# Patient Record
Sex: Female | Born: 1960 | Race: White | Hispanic: No | Marital: Single | Smoking: Never smoker
Health system: Southern US, Community
[De-identification: ages and names within clinical notes are randomized; demographics above are authoritative.]

## PROBLEM LIST (undated history)

## (undated) DIAGNOSIS — R87610 Atypical squamous cells of undetermined significance on cytologic smear of cervix (ASC-US): Secondary | ICD-10-CM

## (undated) DIAGNOSIS — R87810 Cervical high risk human papillomavirus (HPV) DNA test positive: Secondary | ICD-10-CM

## (undated) HISTORY — PX: KNEE SURGERY: SHX244

## (undated) HISTORY — DX: Atypical squamous cells of undetermined significance on cytologic smear of cervix (ASC-US): R87.610

## (undated) HISTORY — DX: Cervical high risk human papillomavirus (HPV) DNA test positive: R87.810

## (undated) HISTORY — PX: OOPHORECTOMY: SHX86

## (undated) HISTORY — PX: CARPAL TUNNEL RELEASE: SHX101

---

## 1998-04-01 ENCOUNTER — Other Ambulatory Visit: Admission: RE | Admit: 1998-04-01 | Discharge: 1998-04-01 | Payer: Self-pay | Admitting: Gynecology

## 1998-10-06 ENCOUNTER — Encounter: Payer: Self-pay | Admitting: Emergency Medicine

## 1998-10-06 ENCOUNTER — Emergency Department (HOSPITAL_COMMUNITY): Admission: EM | Admit: 1998-10-06 | Discharge: 1998-10-06 | Payer: Self-pay | Admitting: Emergency Medicine

## 1999-04-25 ENCOUNTER — Encounter: Admission: RE | Admit: 1999-04-25 | Discharge: 1999-04-25 | Payer: Self-pay | Admitting: Gynecology

## 1999-04-25 ENCOUNTER — Encounter: Payer: Self-pay | Admitting: Gynecology

## 1999-07-10 ENCOUNTER — Other Ambulatory Visit: Admission: RE | Admit: 1999-07-10 | Discharge: 1999-07-10 | Payer: Self-pay | Admitting: Gynecology

## 2000-06-15 ENCOUNTER — Encounter: Payer: Self-pay | Admitting: Gynecology

## 2000-06-15 ENCOUNTER — Encounter: Admission: RE | Admit: 2000-06-15 | Discharge: 2000-06-15 | Payer: Self-pay | Admitting: Gynecology

## 2001-07-07 ENCOUNTER — Encounter: Payer: Self-pay | Admitting: Obstetrics and Gynecology

## 2001-07-07 ENCOUNTER — Encounter: Admission: RE | Admit: 2001-07-07 | Discharge: 2001-07-07 | Payer: Self-pay | Admitting: Obstetrics and Gynecology

## 2002-07-11 ENCOUNTER — Encounter: Admission: RE | Admit: 2002-07-11 | Discharge: 2002-07-11 | Payer: Self-pay

## 2003-07-23 ENCOUNTER — Encounter: Admission: RE | Admit: 2003-07-23 | Discharge: 2003-07-23 | Payer: Self-pay | Admitting: Obstetrics and Gynecology

## 2004-01-11 ENCOUNTER — Other Ambulatory Visit: Admission: RE | Admit: 2004-01-11 | Discharge: 2004-01-11 | Payer: Self-pay | Admitting: Obstetrics and Gynecology

## 2004-04-20 HISTORY — PX: CERVICAL CONE BIOPSY: SUR198

## 2004-08-05 ENCOUNTER — Encounter: Admission: RE | Admit: 2004-08-05 | Discharge: 2004-08-05 | Payer: Self-pay | Admitting: Obstetrics and Gynecology

## 2005-01-13 ENCOUNTER — Other Ambulatory Visit: Admission: RE | Admit: 2005-01-13 | Discharge: 2005-01-13 | Payer: Self-pay | Admitting: Obstetrics and Gynecology

## 2005-02-04 ENCOUNTER — Encounter (INDEPENDENT_AMBULATORY_CARE_PROVIDER_SITE_OTHER): Payer: Self-pay | Admitting: *Deleted

## 2005-02-04 ENCOUNTER — Ambulatory Visit (HOSPITAL_COMMUNITY): Admission: RE | Admit: 2005-02-04 | Discharge: 2005-02-04 | Payer: Self-pay | Admitting: Obstetrics and Gynecology

## 2005-08-10 ENCOUNTER — Encounter: Admission: RE | Admit: 2005-08-10 | Discharge: 2005-08-10 | Payer: Self-pay | Admitting: Family Medicine

## 2005-10-06 ENCOUNTER — Other Ambulatory Visit: Admission: RE | Admit: 2005-10-06 | Discharge: 2005-10-06 | Payer: Self-pay | Admitting: Obstetrics and Gynecology

## 2006-08-18 ENCOUNTER — Encounter: Admission: RE | Admit: 2006-08-18 | Discharge: 2006-08-18 | Payer: Self-pay | Admitting: Obstetrics and Gynecology

## 2006-10-20 ENCOUNTER — Encounter: Admission: RE | Admit: 2006-10-20 | Discharge: 2006-10-20 | Payer: Self-pay | Admitting: Obstetrics and Gynecology

## 2006-11-30 ENCOUNTER — Other Ambulatory Visit: Admission: RE | Admit: 2006-11-30 | Discharge: 2006-11-30 | Payer: Self-pay | Admitting: Obstetrics and Gynecology

## 2007-08-26 ENCOUNTER — Encounter: Admission: RE | Admit: 2007-08-26 | Discharge: 2007-08-26 | Payer: Self-pay | Admitting: Obstetrics and Gynecology

## 2007-12-16 ENCOUNTER — Other Ambulatory Visit: Admission: RE | Admit: 2007-12-16 | Discharge: 2007-12-16 | Payer: Self-pay | Admitting: Obstetrics and Gynecology

## 2008-10-12 ENCOUNTER — Encounter: Admission: RE | Admit: 2008-10-12 | Discharge: 2008-10-12 | Payer: Self-pay | Admitting: Obstetrics and Gynecology

## 2008-10-24 ENCOUNTER — Encounter: Admission: RE | Admit: 2008-10-24 | Discharge: 2008-10-24 | Payer: Self-pay | Admitting: Obstetrics and Gynecology

## 2008-12-17 ENCOUNTER — Other Ambulatory Visit: Admission: RE | Admit: 2008-12-17 | Discharge: 2008-12-17 | Payer: Self-pay | Admitting: Obstetrics and Gynecology

## 2009-10-31 ENCOUNTER — Encounter: Admission: RE | Admit: 2009-10-31 | Discharge: 2009-10-31 | Payer: Self-pay | Admitting: Obstetrics and Gynecology

## 2009-12-26 ENCOUNTER — Other Ambulatory Visit: Admission: RE | Admit: 2009-12-26 | Discharge: 2009-12-26 | Payer: Self-pay | Admitting: Obstetrics and Gynecology

## 2010-05-12 ENCOUNTER — Encounter: Payer: Self-pay | Admitting: Obstetrics and Gynecology

## 2010-06-04 ENCOUNTER — Ambulatory Visit (INDEPENDENT_AMBULATORY_CARE_PROVIDER_SITE_OTHER): Payer: PRIVATE HEALTH INSURANCE | Admitting: Gynecology

## 2010-06-04 DIAGNOSIS — Z30431 Encounter for routine checking of intrauterine contraceptive device: Secondary | ICD-10-CM

## 2010-06-04 DIAGNOSIS — N926 Irregular menstruation, unspecified: Secondary | ICD-10-CM

## 2010-06-05 ENCOUNTER — Other Ambulatory Visit: Payer: PRIVATE HEALTH INSURANCE

## 2010-06-05 ENCOUNTER — Ambulatory Visit (INDEPENDENT_AMBULATORY_CARE_PROVIDER_SITE_OTHER): Payer: PRIVATE HEALTH INSURANCE | Admitting: Gynecology

## 2010-06-05 DIAGNOSIS — N926 Irregular menstruation, unspecified: Secondary | ICD-10-CM

## 2010-06-05 DIAGNOSIS — N930 Postcoital and contact bleeding: Secondary | ICD-10-CM

## 2010-06-05 DIAGNOSIS — N949 Unspecified condition associated with female genital organs and menstrual cycle: Secondary | ICD-10-CM

## 2010-09-05 NOTE — H&P (Signed)
Melanie Valenzuela, Melanie Valenzuela            ACCOUNT NO.:  [id number]   MEDICAL RECORD NO.:  [id number]          PATIENT TYPE:  AMB   LOCATION:  SDC                           FACILITY:  [facility]   PHYSICIAN:  Gerald Leitz, MD          DATE OF BIRTH:  [birth year]   DATE OF ADMISSION:  02/04/2005  DATE OF DISCHARGE:                                HISTORY & PHYSICAL   CHIEF COMPLAINT:  Cervical dysplasia.   HISTORY OF PRESENT ILLNESS:  This is a 50 year old gravida 2, para 2, who  has cervical dysplasia.  She had a Pap smear performed on January 13, 2005, that showed atypical squamous cells, cannot rule out high grade  lesions.  She had a cervical polyp removed that showed a squamous mucosa  with high grade squamous intraepithelial lesions.  She underwent a  colposcopy and had biopsies performed as well as an endocervical curettage.  The endocervical curettage came back high grade squamous intraepithelial  lesions, CIN-2 or 3.  Cervical biopsies at the 7 and 3 o'clock positions  showed low grade lesions.  She is scheduled to undergo cold knife conization  for further evaluation and possible treatment of her cervical dysplasia.   PAST OB/GYN HISTORY:  History of abnormal Pap smear status post cryosurgery  over 20 years ago.  Denies any history of sexually transmitted disease.  Spontaneous vaginal delivery x2.   PAST MEDICAL HISTORY:  Migraine headaches.   PAST SURGICAL HISTORY:  Mole from the abdomen and umbilical area which were  benign.  Ovarian cyst with laparotomy and knee surgery.   MEDICATIONS:  Yasmin.   ALLERGIES:  No known drug allergies.   SOCIAL HISTORY:  She is currently divorced.  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  She denies tobacco, alcohol, or illicit drug use.   FAMILY HISTORY:  No immediate family history of breast, ovarian, or colon  cancer.   REVIEW OF SYSTEMS:  Negative except as stated in history of current illness.   PHYSICAL EXAMINATION:  LUNGS:  Clear to  auscultation bilaterally.  HEART:  Regular rate and rhythm.  ABDOMEN:  Soft, nontender, and nondistended.  No masses.  EXTREMITIES:  No edema.  PELVIC:  Normal external female genitalia, no vulvar or vaginal lesions.  Bimanual examination approximately 10-12 weeks size uterus.  No adnexal  masses or tenderness.   ASSESSMENT:  Cervical dysplasia.  She will undergo cold knife conization for  treatment and further evaluation.  Risks, benefits, and alternatives to  surgery were discussed with the patient including but not limited to  infection,  bleeding, damage to surrounding organs, need for further surgery.  Also  discussed with the patient risks of transfusion including HIV, hepatitis B  and C, and transfusion reactions.  The patient understands and wishes to  proceed with cold knife conization.      Gerald Leitz, MD  Electronically Signed     TC/MEDQ  D:  01/30/2005  T:  01/30/2005  Job:  [id number]   cc:   [facility]

## 2010-09-05 NOTE — Op Note (Signed)
Melanie Valenzuela, SCHEWE            ACCOUNT NO.:  [id number]   MEDICAL RECORD NO.:  [id number]          PATIENT TYPE:  AMB   LOCATION:  SDC                           [facility]ACILITY:  [facility]   PHYSICIAN:  Gerald Leitz, MD          DATE OF BIRTH:  [birth year]   DATE OF PROCEDURE:  02/04/2005  DATE OF DISCHARGE:                                 OPERATIVE REPORT   PREOPERATIVE DIAGNOSIS:  Cervical dysplasia.   POSTOPERATIVE DIAGNOSIS:  Cervical dysplasia.   OPERATION/PROCEDURE:  Cold knife conization.   SURGEON:  Gerald Leitz, M.D.   ASSISTANT:  None.   ANESTHESIA:  MAC with paracervical block.   COMPLICATIONS:  None.   ESTIMATED BLOOD LOSS:  Approximately 10 mL.   INDICATIONS:  This is a 50 year old who has a Pap smear that showed ASCUS.  Cannot rule out high grade.  She had biopsy of two cervical polyps that were  found to be high-grade and is undergoing a cold knife conization for further  evaluation and treatment.   DESCRIPTION OF PROCEDURE:  The patient was taken to the operating room where  she was placed under MAC anesthesia.  The vagina was prepped in the usual  sterile fashion.  The bladder was emptied with in-and-out catheter with  approximately 100 mL. The patient was then draped in the usual sterile  fashion.  The weighted speculum was placed in the vagina.  Deaver was used  for retraction.  The cervix was grasped anteriorly with a single-tooth  tenaculum and 5 mL of 1% lidocaine with epinephrine were injected at the 4  o'clock and 7 o'clock positions to produce a paracervical block.  0 Monocryl  was placed at the 3 o'clock position and the 9 o'clock position and used for  traction.  The single-tooth tenaculum was removed.  Cold knife cone  assessment was obtained using 11-blade scalpel.  The cervix was incised  circumferentially and the conization assessment was removed.  Hemostasis was  obtained with Monsel's solution.  Excellent hemostasis was noted.  Surgicel  was placed into  the incision site.  The 0 Vicryls were tied across the  cervix and cut.  Again excellent hemostasis was noted.  The Deaver and  weighted  speculum were removed from the patient's vagina.  He was taken to the  recovery room awake and in stable condition.  Again there were no  complications.  Specimens:  Cold knife cone of the cervix labeled at the 12  o'clock position with suture.      Gerald Leitz, MD  Electronically Signed     TC/MEDQ  D:  02/04/2005  T:  02/05/2005  Job:  [id number]

## 2010-09-29 ENCOUNTER — Other Ambulatory Visit: Payer: Self-pay | Admitting: Family Medicine

## 2010-09-29 DIAGNOSIS — Z1231 Encounter for screening mammogram for malignant neoplasm of breast: Secondary | ICD-10-CM

## 2010-10-14 ENCOUNTER — Other Ambulatory Visit: Payer: Self-pay | Admitting: Physician Assistant

## 2010-11-05 ENCOUNTER — Ambulatory Visit: Payer: PRIVATE HEALTH INSURANCE

## 2010-11-07 ENCOUNTER — Ambulatory Visit
Admission: RE | Admit: 2010-11-07 | Discharge: 2010-11-07 | Disposition: A | Discharge status: Home / Self Care | Payer: PRIVATE HEALTH INSURANCE | Source: Ambulatory Visit | Attending: Family Medicine | Admitting: Family Medicine

## 2010-11-07 DIAGNOSIS — Z1231 Encounter for screening mammogram for malignant neoplasm of breast: Secondary | ICD-10-CM

## 2011-02-05 ENCOUNTER — Encounter: Payer: Self-pay | Admitting: Gynecology

## 2011-02-05 DIAGNOSIS — IMO0002 Reserved for concepts with insufficient information to code with codable children: Secondary | ICD-10-CM | POA: Insufficient documentation

## 2011-02-09 ENCOUNTER — Encounter: Payer: PRIVATE HEALTH INSURANCE | Admitting: Gynecology

## 2011-03-04 ENCOUNTER — Other Ambulatory Visit (HOSPITAL_COMMUNITY)
Admission: RE | Admit: 2011-03-04 | Discharge: 2011-03-04 | Disposition: A | Discharge status: Home / Self Care | Payer: PRIVATE HEALTH INSURANCE | Source: Ambulatory Visit | Attending: Gynecology | Admitting: Gynecology

## 2011-03-04 ENCOUNTER — Encounter: Payer: Self-pay | Admitting: Gynecology

## 2011-03-04 ENCOUNTER — Ambulatory Visit (INDEPENDENT_AMBULATORY_CARE_PROVIDER_SITE_OTHER): Payer: PRIVATE HEALTH INSURANCE | Admitting: Gynecology

## 2011-03-04 VITALS — BP 110/60 | Ht 62.0 in | Wt 127.0 lb

## 2011-03-04 DIAGNOSIS — Z01419 Encounter for gynecological examination (general) (routine) without abnormal findings: Secondary | ICD-10-CM | POA: Insufficient documentation

## 2011-03-04 DIAGNOSIS — B977 Papillomavirus as the cause of diseases classified elsewhere: Secondary | ICD-10-CM | POA: Insufficient documentation

## 2011-03-04 DIAGNOSIS — G43909 Migraine, unspecified, not intractable, without status migrainosus: Secondary | ICD-10-CM | POA: Insufficient documentation

## 2011-03-04 DIAGNOSIS — Z1159 Encounter for screening for other viral diseases: Secondary | ICD-10-CM | POA: Insufficient documentation

## 2011-03-04 DIAGNOSIS — Z30431 Encounter for routine checking of intrauterine contraceptive device: Secondary | ICD-10-CM

## 2011-03-04 DIAGNOSIS — N643 Galactorrhea not associated with childbirth: Secondary | ICD-10-CM

## 2011-03-04 NOTE — Progress Notes (Signed)
Melanie Valenzuela [birth year] [id number]        50 y.o.  for annual exam.  Doing well. She has a Mirena IUD since September 2009 has sporadic occasional periods.  Past medical history,surgical history, medications, allergies, family history and social history were all reviewed and documented in the EPIC chart. ROS:  Was performed and pertinent positives and negatives are included in the history.  Exam: chaperone present Filed Vitals:   03/04/11 1531  BP: 110/60   General appearance  Normal Skin grossly normal Head/Neck normal with no cervical or supraclavicular adenopathy thyroid normal Lungs  clear Cardiac RR, without RMG Abdominal  soft, nontender, without masses, organomegaly or hernia Breasts  examined lying and sitting without masses, retractions, discharge or axillary adenopathy. Pelvic  Ext/BUS/vagina  normal   Cervix  normal  IUD string visualized, Pap done  Uterus  anteverted, normal size, shape and contour, midline and mobile nontender   Adnexa  Without masses or tenderness    Anus and perineum  normal   Rectovaginal  normal sphincter tone without palpated masses or tenderness.    Assessment/Plan:  50 y.o. female for annual exam.    1. IUD contraception. Patient is doing well with this with sporadic menses. Placed 12/2007. 2. 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 Follow up Pap smears have been normal. Pap was done today we'll continue annual cytology. 3. Galactorrhea.  Patient notes bilateral clear to milky galactorrhea primarily during intercourse. Never bloody, normal self breast exams, recent mammogram July 2012. We'll check baseline prolactin but assuming normal will continue expected management. If ever bloody she is to follow up ASAP. 4. Health maintenance. She sees Dr. Cam Hai routinely and does her blood work. The blood work was done today other than her prolactin level. Assuming she continues well from a gynecologic  standpoint and she'll see me in a year sooner as needed.    Dara Lords MD, 4:14 PM 03/04/2011

## 2011-03-11 ENCOUNTER — Telehealth: Payer: Self-pay | Admitting: *Deleted

## 2011-03-11 NOTE — Telephone Encounter (Signed)
SPOKE WITH PT THIS AM ABOUT RECENT RESULT ON PAP REGARDING MILD ATYPIA & HIGH RISK HPV SCREEN NEGATIVE,AND RECHECK IN 6 MONTHS. PT IS VERY CONCERNED BECAUSE HER PAP SMEARS CONTINUE TO COME BACK ABNORMAL SHE MADE AN OFFICE VISIT TO SPEAK WITH YOU ABOUT THIS ON 03/18/11. BUT THEN PT CALLS BACK STATING SHE VERY WORRIED ABOUT THIS PAP RESULT AND WOULD LIKE TO SPEAK WITH YOU VIA PHONE. PLEASE ADVISE

## 2011-03-11 NOTE — Telephone Encounter (Signed)
Pt spoke with TF.

## 2011-03-11 NOTE — Telephone Encounter (Signed)
Call patient with Pap smear results which showed ASCUS negative high risk HPV. In review of her chart she had negative annual Pap smears and 2011 through 2008. She does have a history of positive high-risk HPV in the past number of years ago and she is very concerned about this she is status post cold-like conization. I reviewed with her ASCUS negative high risk HPV that my recommendation would be to repeat the Pap smear in 6 months and we'll follow her expectantly. If you have persistent ASCUS high-risk HPV then we would proceed with colposcopy. She is comfortable with this knows the importance of follow up and will return in 6 months.

## 2011-03-11 NOTE — Telephone Encounter (Signed)
Get patient on the phone and I need the chart

## 2011-03-17 ENCOUNTER — Telehealth: Payer: Self-pay | Admitting: *Deleted

## 2011-03-17 NOTE — Telephone Encounter (Signed)
Pt called wanting to know if she could have her repeat pap in 3 months rather than 6 months? You had a detailed telephone conversation with pt about her pap results on 03/11/11. I explained to pt that insurance may not cover this pap if done in 3 months, pt says she doesn't care she is very concerned. Please advise if pap can be done in 3 months rather than 6 months.

## 2011-03-17 NOTE — Telephone Encounter (Signed)
okay

## 2011-03-17 NOTE — Telephone Encounter (Signed)
Lm on pt vm okay to have 3 month pap rather than 6 month pap follow up.

## 2011-03-18 ENCOUNTER — Ambulatory Visit: Payer: PRIVATE HEALTH INSURANCE | Admitting: Gynecology

## 2011-03-27 ENCOUNTER — Encounter: Payer: Self-pay | Admitting: Gynecology

## 2011-03-27 ENCOUNTER — Ambulatory Visit (INDEPENDENT_AMBULATORY_CARE_PROVIDER_SITE_OTHER): Payer: PRIVATE HEALTH INSURANCE | Admitting: Gynecology

## 2011-03-27 DIAGNOSIS — N9089 Other specified noninflammatory disorders of vulva and perineum: Secondary | ICD-10-CM

## 2011-03-27 NOTE — Progress Notes (Signed)
Patient presents having just felt a bump on her left labia minora that she wants me to look at. It does not hurt she never noticed this before.  Exam with chaperone present Pelvic external with small sebaceous-like cyst several millimeters across upper left labia minus consistent with a sebaceous cyst. Remainder of her external exam is normal. BUS vagina normal to inspection and palpation. Cervix normal IUD string visualized. Uterus normal size midline mobile nontender. Adnexa without masses or tenderness.  Assessment and plan: Small benign-appearing sebaceous cyst upper left labia minus. Patient will observe as long as it remains stable or smaller she'll follow up enlarges or starts to bother her she'll represent for evaluation. Patient is coming back in 3 months for repeat Pap smear due to ascus negative high risk HPV. I had recommended a one-year follow up but she felt more comfortable with a sooner repeat.

## 2011-03-27 NOTE — Patient Instructions (Signed)
Continue with self exam. As long as cyst remains stable or gets smaller then we'll watch. If increases in size or starts causing pain follow up for evaluation

## 2011-05-26 ENCOUNTER — Ambulatory Visit: Payer: PRIVATE HEALTH INSURANCE | Admitting: Gynecology

## 2011-05-27 ENCOUNTER — Ambulatory Visit: Payer: PRIVATE HEALTH INSURANCE | Admitting: Gynecology

## 2011-06-02 ENCOUNTER — Other Ambulatory Visit (HOSPITAL_COMMUNITY)
Admission: RE | Admit: 2011-06-02 | Discharge: 2011-06-02 | Disposition: A | Discharge status: Home / Self Care | Payer: PRIVATE HEALTH INSURANCE | Source: Ambulatory Visit | Attending: Gynecology | Admitting: Gynecology

## 2011-06-02 ENCOUNTER — Encounter: Payer: Self-pay | Admitting: Gynecology

## 2011-06-02 ENCOUNTER — Ambulatory Visit (INDEPENDENT_AMBULATORY_CARE_PROVIDER_SITE_OTHER): Payer: PRIVATE HEALTH INSURANCE | Admitting: Gynecology

## 2011-06-02 DIAGNOSIS — R87610 Atypical squamous cells of undetermined significance on cytologic smear of cervix (ASC-US): Secondary | ICD-10-CM

## 2011-06-02 DIAGNOSIS — Z01419 Encounter for gynecological examination (general) (routine) without abnormal findings: Secondary | ICD-10-CM | POA: Insufficient documentation

## 2011-06-02 NOTE — Progress Notes (Signed)
Patient presents for follow up Pap smear. She had an ASCUS negative high-risk HPV 3 months ago and was recommended to have a repeat in a year but she was very nervous on to have a 3 month follow up. She does have a history of high-grade dysplasia in the past. Also was recently seen with a small sebaceous type cyst in the upper left labia which she says has resolved her self-exam.  Exam with Selena Batten chaperone present Pelvic external BUS vagina normal no evidence of prior cyst. Cervix normal IUD string visualized Pap done.  Assessment and plan: History of ascus negative high-risk HPV for repeat Pap smear. Assuming negative we'll repeat a urine she's due for her annual. If persistently atypical then we'll proceed with colposcopy. Prior small sebaceous type cyst has resolved the patient will continue with self exams of any recurrences or other issue she will represent.

## 2011-06-02 NOTE — Patient Instructions (Signed)
Follow-up for Pap smear results. 

## 2011-06-30 ENCOUNTER — Other Ambulatory Visit: Payer: Self-pay | Admitting: Physician Assistant

## 2011-07-07 ENCOUNTER — Other Ambulatory Visit: Payer: Self-pay | Admitting: Family Medicine

## 2011-07-07 DIAGNOSIS — N63 Unspecified lump in unspecified breast: Secondary | ICD-10-CM

## 2011-07-07 DIAGNOSIS — N644 Mastodynia: Secondary | ICD-10-CM

## 2011-07-15 ENCOUNTER — Ambulatory Visit
Admission: RE | Admit: 2011-07-15 | Discharge: 2011-07-15 | Disposition: A | Discharge status: Home / Self Care | Payer: PRIVATE HEALTH INSURANCE | Source: Ambulatory Visit | Attending: Family Medicine | Admitting: Family Medicine

## 2011-07-15 DIAGNOSIS — N644 Mastodynia: Secondary | ICD-10-CM

## 2011-07-15 DIAGNOSIS — N63 Unspecified lump in unspecified breast: Secondary | ICD-10-CM

## 2011-08-13 ENCOUNTER — Other Ambulatory Visit: Payer: Self-pay | Admitting: Physician Assistant

## 2011-09-22 ENCOUNTER — Telehealth: Payer: Self-pay | Admitting: *Deleted

## 2011-09-22 NOTE — Telephone Encounter (Signed)
Left message on pt voicemail that no repeat pap needed per result note 06/02/11

## 2011-11-03 ENCOUNTER — Other Ambulatory Visit: Payer: Self-pay | Admitting: Family Medicine

## 2011-11-03 DIAGNOSIS — Z1231 Encounter for screening mammogram for malignant neoplasm of breast: Secondary | ICD-10-CM

## 2011-11-25 ENCOUNTER — Ambulatory Visit
Admission: RE | Admit: 2011-11-25 | Discharge: 2011-11-25 | Disposition: A | Discharge status: Home / Self Care | Payer: BC Managed Care – PPO | Source: Ambulatory Visit | Attending: Family Medicine | Admitting: Family Medicine

## 2011-11-25 DIAGNOSIS — Z1231 Encounter for screening mammogram for malignant neoplasm of breast: Secondary | ICD-10-CM

## 2012-02-05 ENCOUNTER — Other Ambulatory Visit: Payer: Self-pay | Admitting: Family Medicine

## 2012-02-05 DIAGNOSIS — M79629 Pain in unspecified upper arm: Secondary | ICD-10-CM

## 2012-02-26 ENCOUNTER — Ambulatory Visit
Admission: RE | Admit: 2012-02-26 | Discharge: 2012-02-26 | Disposition: A | Discharge status: Home / Self Care | Payer: BC Managed Care – PPO | Source: Ambulatory Visit | Attending: Family Medicine | Admitting: Family Medicine

## 2012-02-26 ENCOUNTER — Other Ambulatory Visit: Payer: Self-pay | Admitting: Family Medicine

## 2012-02-26 DIAGNOSIS — M79629 Pain in unspecified upper arm: Secondary | ICD-10-CM

## 2012-03-09 ENCOUNTER — Other Ambulatory Visit (HOSPITAL_COMMUNITY)
Admission: RE | Admit: 2012-03-09 | Discharge: 2012-03-09 | Disposition: A | Discharge status: Home / Self Care | Payer: BC Managed Care – PPO | Source: Ambulatory Visit | Attending: Gynecology | Admitting: Gynecology

## 2012-03-09 ENCOUNTER — Encounter: Payer: Self-pay | Admitting: Gynecology

## 2012-03-09 ENCOUNTER — Ambulatory Visit (INDEPENDENT_AMBULATORY_CARE_PROVIDER_SITE_OTHER): Payer: BC Managed Care – PPO | Admitting: Gynecology

## 2012-03-09 VITALS — BP 110/60 | Ht 63.0 in | Wt 129.0 lb

## 2012-03-09 DIAGNOSIS — Z30431 Encounter for routine checking of intrauterine contraceptive device: Secondary | ICD-10-CM

## 2012-03-09 DIAGNOSIS — Z1151 Encounter for screening for human papillomavirus (HPV): Secondary | ICD-10-CM | POA: Insufficient documentation

## 2012-03-09 DIAGNOSIS — Z01419 Encounter for gynecological examination (general) (routine) without abnormal findings: Secondary | ICD-10-CM

## 2012-03-09 NOTE — Patient Instructions (Signed)
Followup for Pap smear results. Followup in one year for annual exam. 

## 2012-03-09 NOTE — Addendum Note (Signed)
Addended by: Richardson Chiquito on: 03/09/2012 05:03 PM   Modules accepted: Orders

## 2012-03-09 NOTE — Progress Notes (Signed)
Kateland Leisinger Shrader [birth year] [id number]        51 y.o.  [id number] for annual exam.    Past medical history,surgical history, medications, allergies, family history and social history were all reviewed and documented in the EPIC chart. ROS:  Was performed and pertinent positives and negatives are included in the history.  Exam: Sherrilyn Rist assistant Filed Vitals:   03/09/12 1618  BP: 110/60  Height: 5\' 3"  (1.6 m)  Weight: 129 lb (58.514 kg)   General appearance  Normal Skin grossly normal Head/Neck normal with no cervical or supraclavicular adenopathy thyroid normal Lungs  clear Cardiac RR, without RMG Abdominal  soft, nontender, without masses, organomegaly or hernia Breasts  examined lying and sitting without masses, retractions, discharge or axillary adenopathy. Pelvic  Ext/BUS/vagina  normal   Cervix  normal Pap/HPV, IUD string visualized  Uterus  anteverted, normal size, shape and contour, midline and mobile nontender   Adnexa  Without masses or tenderness    Anus and perineum  normal   Rectovaginal  normal sphincter tone without palpated masses or tenderness.    Assessment/Plan:  51 y.o. [id number] female for annual exam.   1. Mirena IUD 12/2007. Patient doing well. Does still have menses but will skip a month every once in a while. 2 to be replaced next year I reminded her of this.  No hot flashes/night sweats to suggest menopausal changes. 2. Pap smear. Pap/HPV done today. History of cone biopsy for high-grade dysplasia 2006.  Ascus negative high-risk HPV 02/2011. Benign reactive changes 05/2011. Patient very anxious about the abnormality and is requesting more frequent Pap smear screening. We'll follow her for these results and as long as these are benign then we'll continue with annual cytology/HPV. 3. Mammography 02/2012. Had follow up views from August which were stable and recommended repeat August 2014. SBE monthly reviewed. 4. Colonoscopy 12/2011. Follow up if there recommended  interval.  5. Health maintenance. No blood work done today since all done through her primary physician's office. Follow up one year, sooner as needed.    Dara Lords MD, 4:44 PM 03/09/2012

## 2012-04-29 ENCOUNTER — Other Ambulatory Visit: Payer: Self-pay | Admitting: Family Medicine

## 2012-10-28 ENCOUNTER — Encounter: Payer: Self-pay | Admitting: Gynecology

## 2012-10-28 ENCOUNTER — Ambulatory Visit (INDEPENDENT_AMBULATORY_CARE_PROVIDER_SITE_OTHER): Payer: BC Managed Care – PPO | Admitting: Gynecology

## 2012-10-28 VITALS — BP 120/72

## 2012-10-28 DIAGNOSIS — N926 Irregular menstruation, unspecified: Secondary | ICD-10-CM

## 2012-10-28 MED ORDER — SUMATRIPTAN SUCCINATE 100 MG PO TABS: 100.0000 mg | ORAL_TABLET | ORAL | Status: DC | PRN

## 2012-10-28 NOTE — Patient Instructions (Signed)
Follow up for ultrasound as scheduled 

## 2012-10-28 NOTE — Progress Notes (Signed)
Patient notes resumption of regular monthly menses this year. Now over the last several weeks she has bled on and off continuously. She has a Mirena IUD inserted 12/2007. Not having hot flashes night sweats or other symptoms.  Exam with Blanca Asst. Abdomen soft nontender without masses guarding rebound organomegaly. Pelvic external BUS vagina grossly normal without active bleeding. Cervix normal IUD string visualized. Uterus anteverted normal size midline mobile nontender. Adnexa without masses or tenderness.  Assessment and plan: Irregular bleeding year 5 of IUD due to be replaced soon. Check baseline labs to include TSH FSH prolactin hCG. Start with ultrasound for pelvic assessment. Patient will followup with these results and we'll go from there. Issues about replacing her IUD now versus removing, using backup contraception for 6 months and seeing what we do from a menstrual standpoint discussed. We'll await her [facility] results and other lab work and then further discuss.

## 2012-11-04 ENCOUNTER — Encounter: Payer: Self-pay | Admitting: Gynecology

## 2012-11-04 ENCOUNTER — Ambulatory Visit (INDEPENDENT_AMBULATORY_CARE_PROVIDER_SITE_OTHER): Payer: BC Managed Care – PPO

## 2012-11-04 ENCOUNTER — Ambulatory Visit (INDEPENDENT_AMBULATORY_CARE_PROVIDER_SITE_OTHER): Payer: BC Managed Care – PPO | Admitting: Gynecology

## 2012-11-04 DIAGNOSIS — T8389XD Other specified complication of genitourinary prosthetic devices, implants and grafts, subsequent encounter: Secondary | ICD-10-CM

## 2012-11-04 DIAGNOSIS — N83209 Unspecified ovarian cyst, unspecified side: Secondary | ICD-10-CM

## 2012-11-04 DIAGNOSIS — N926 Irregular menstruation, unspecified: Secondary | ICD-10-CM

## 2012-11-04 NOTE — Patient Instructions (Signed)
Followup in 2 months for ultrasound as scheduled.

## 2012-11-04 NOTE — Progress Notes (Signed)
Patient follows up for ultrasound with history of irregular bleeding with Mirena IUD.  Ultrasound shows normal uterine size. Endometrial echo 3.5 mm. IUD in normal location. Right ovary normal. Left ovary with echo-free thin-walled avascular cyst 42 x 31 x 33 mm (35 mm mean). Smaller cyst adjacent. Larger cyst with groundglass appearance. Smaller cyst with more echogenic groundglass appearance. No free fluid.  Assessment and plan: Irregular bleeding 52 year old with IUD. Hormone studies to include hCG TSH FSH prolactin normal.  IUD was in normal location on ultrasound. Left cystic mass 35mm mean avascular questionable small endometrioma versus hemorrhagic cyst. Reviewed differential to include physiologic versus tumor. Options for management reviewed to include surgery now versus expectant management. Recommend patient plan expectant management for now with re\re ultrasound in 2 months. Will hold on CA 125 given small size and benign appearance as well as possibility for endometriosis causing a confusing value. Patient understands cannot guarantee not early malignancy but this point she is comfortable with observation with re\re ultrasound in 2 months. We'll continue to follow her bleeding continues and may consider removing the IUD and ultimate endometrial assessment if irregular bleeding continues despite that.

## 2012-11-08 ENCOUNTER — Other Ambulatory Visit: Payer: Self-pay | Admitting: Gynecology

## 2012-11-08 ENCOUNTER — Telehealth: Payer: Self-pay | Admitting: Gynecology

## 2012-11-08 DIAGNOSIS — Z3049 Encounter for surveillance of other contraceptives: Secondary | ICD-10-CM

## 2012-11-08 MED ORDER — LEVONORGESTREL 20 MCG/24HR IU IUD: INTRAUTERINE_SYSTEM | Freq: Once | INTRAUTERINE | Status: DC

## 2012-11-08 NOTE — Telephone Encounter (Signed)
11/08/12-I LM VM that pt [facility] insurance covers the MIrena & insertion at 100%, no copay or deductible. She will call to schedule appt with next cycle/WL

## 2012-11-09 ENCOUNTER — Other Ambulatory Visit: Payer: Self-pay

## 2012-11-09 DIAGNOSIS — Z1231 Encounter for screening mammogram for malignant neoplasm of breast: Secondary | ICD-10-CM

## 2012-11-18 ENCOUNTER — Other Ambulatory Visit: Payer: BC Managed Care – PPO

## 2012-11-18 ENCOUNTER — Ambulatory Visit: Payer: BC Managed Care – PPO | Admitting: Gynecology

## 2012-11-29 ENCOUNTER — Ambulatory Visit: Payer: BC Managed Care – PPO

## 2012-12-13 ENCOUNTER — Ambulatory Visit
Admission: RE | Admit: 2012-12-13 | Discharge: 2012-12-13 | Disposition: A | Discharge status: Home / Self Care | Payer: BC Managed Care – PPO | Source: Ambulatory Visit

## 2012-12-13 DIAGNOSIS — Z1231 Encounter for screening mammogram for malignant neoplasm of breast: Secondary | ICD-10-CM

## 2012-12-21 ENCOUNTER — Ambulatory Visit (INDEPENDENT_AMBULATORY_CARE_PROVIDER_SITE_OTHER): Payer: BC Managed Care – PPO

## 2012-12-21 ENCOUNTER — Encounter: Payer: Self-pay | Admitting: Gynecology

## 2012-12-21 ENCOUNTER — Ambulatory Visit (INDEPENDENT_AMBULATORY_CARE_PROVIDER_SITE_OTHER): Payer: BC Managed Care – PPO | Admitting: Gynecology

## 2012-12-21 DIAGNOSIS — N8300 Follicular cyst of ovary, unspecified side: Secondary | ICD-10-CM

## 2012-12-21 DIAGNOSIS — N921 Excessive and frequent menstruation with irregular cycle: Secondary | ICD-10-CM

## 2012-12-21 DIAGNOSIS — Z975 Presence of (intrauterine) contraceptive device: Secondary | ICD-10-CM

## 2012-12-21 DIAGNOSIS — N83209 Unspecified ovarian cyst, unspecified side: Secondary | ICD-10-CM

## 2012-12-21 NOTE — Progress Notes (Signed)
Patient presents for followup ultrasound. Was having irregular bleeding with Mirena IUD and had an ultrasound which showed a 35 mm mean cyst on the left ovary. She was very concerned about the possibility of ovarian tumors and requested rescanning in a short interval.  Ultrasound today shows uterus normal size. Endometrial echo 1.9 mm. IUD within the normal location. Right and left ovaries with physiologic changes of prior cyst has resolved. Cul-de-sac negative.  Assessment and plan: Resolution of the prior cyst. She was asymptomatic from this and the ultrasound originally was ordered because of her bleeding. She no longer is bleeding with her IUD. It is due to be removed in September and the question as to whether to replace it or remove it and monitor at age 52 was reviewed. She's not having menopausal symptoms and had a normal FSH in July 2014.  She has her annual exam scheduled in November and asked if she could wait until then to have it removed or replaced at her choice. I reviewed technically the contraceptive efficacy cannot be assured after 5 years even by months but that there were studies to imply the efficacy does go longer than 5 years. She has decided to keep it through November at her choice and then we'll decide whether to have it replaced or removed with alternative contraception and monitoring of her menses.

## 2012-12-21 NOTE — Patient Instructions (Signed)
Followup in November for your annual exam and IUD decision.

## 2013-01-04 ENCOUNTER — Other Ambulatory Visit: Payer: BC Managed Care – PPO

## 2013-01-04 ENCOUNTER — Ambulatory Visit: Payer: BC Managed Care – PPO | Admitting: Gynecology

## 2013-01-25 ENCOUNTER — Telehealth: Payer: Self-pay | Admitting: *Deleted

## 2013-01-25 NOTE — Telephone Encounter (Signed)
Pt would like PCP Dr.Kimblery Clelia Croft to have recent ultrasound report.  Most recent report faxed.

## 2013-02-27 ENCOUNTER — Ambulatory Visit
Admission: RE | Admit: 2013-02-27 | Discharge: 2013-02-27 | Disposition: A | Discharge status: Home / Self Care | Payer: BC Managed Care – PPO | Source: Ambulatory Visit | Attending: Family Medicine | Admitting: Family Medicine

## 2013-02-27 ENCOUNTER — Other Ambulatory Visit: Payer: Self-pay | Admitting: Family Medicine

## 2013-02-27 DIAGNOSIS — K219 Gastro-esophageal reflux disease without esophagitis: Secondary | ICD-10-CM

## 2013-02-27 DIAGNOSIS — M25511 Pain in right shoulder: Secondary | ICD-10-CM

## 2013-03-10 ENCOUNTER — Other Ambulatory Visit (HOSPITAL_COMMUNITY)
Admission: RE | Admit: 2013-03-10 | Discharge: 2013-03-10 | Disposition: A | Discharge status: Home / Self Care | Payer: BC Managed Care – PPO | Source: Ambulatory Visit | Attending: Gynecology | Admitting: Gynecology

## 2013-03-10 ENCOUNTER — Ambulatory Visit (INDEPENDENT_AMBULATORY_CARE_PROVIDER_SITE_OTHER): Payer: BC Managed Care – PPO | Admitting: Gynecology

## 2013-03-10 ENCOUNTER — Encounter: Payer: Self-pay | Admitting: Gynecology

## 2013-03-10 VITALS — BP 116/74 | Ht 63.0 in | Wt 127.0 lb

## 2013-03-10 DIAGNOSIS — Z01419 Encounter for gynecological examination (general) (routine) without abnormal findings: Secondary | ICD-10-CM | POA: Insufficient documentation

## 2013-03-10 DIAGNOSIS — N9089 Other specified noninflammatory disorders of vulva and perineum: Secondary | ICD-10-CM

## 2013-03-10 DIAGNOSIS — N83209 Unspecified ovarian cyst, unspecified side: Secondary | ICD-10-CM

## 2013-03-10 DIAGNOSIS — Z1151 Encounter for screening for human papillomavirus (HPV): Secondary | ICD-10-CM | POA: Insufficient documentation

## 2013-03-10 DIAGNOSIS — Z30431 Encounter for routine checking of intrauterine contraceptive device: Secondary | ICD-10-CM

## 2013-03-10 NOTE — Progress Notes (Signed)
This is well below one year if Melanie Valenzuela 1961-01-08 [id number]        52 y.o.  G2P2002 for annual exam.  Several issues noted below.  Past medical history,surgical history, problem list, medications, allergies, family history and social history were all reviewed and documented in the EPIC chart.  ROS:  Performed and pertinent positives and negatives are included in the history, assessment and plan .  Exam: Kim assistant Filed Vitals:   03/10/13 1558  BP: 116/74  Height: 5\' 3"  (1.6 m)  Weight: 127 lb (57.607 kg)   General appearance  Normal Skin grossly normal Head/Neck normal with no cervical or supraclavicular adenopathy thyroid normal Lungs  clear Cardiac RR, without RMG Abdominal  soft, nontender, without masses, organomegaly or hernia Breasts  examined lying and sitting without masses, retractions, discharge or axillary adenopathy. Pelvic  Ext/BUS/vagina  normal. Very small benign appearing mole right mid labia majora  Cervix  normal with IUD string visualized. Pap/HPV done  Uterus  anteverted, normal size, shape and contour, midline and mobile nontender   Adnexa  Without masses or tenderness    Anus and perineum  normal   Rectovaginal  normal sphincter tone without palpated masses or tenderness.    Assessment/Plan:  52 y.o. G34P2002 female for annual exam, regular menses, Mirena IUD.   1. Regular menses/Mirena IUD.  Continues to have monthly menses. At the 5 year mark with her Mirena IUD. Options to remove and replace versus removing monitor discussed. Had normal FSH in July. Patient has decided to have it removed, use backup contraception and monitor her cycles for now. Her IUD string was visualized, grasped with the [facility] forcep and the Mirena IUD was removed, shown to the patient and discarded. Patient will keep menstrual calendar and we'll see how she does. Currently not having any menopausal symptoms. Need to use backup contraception stressed. 2. Pap smear/HPV  negative 2013.  History of cone biopsy for HGSIL 2006. Pap smears have been normal afterwards until 2 years ago when she had ASCUS negative high risk HPV. She has had 2 subsequent negative Pap smears. She is very concerned and requests Pap/HPV this year. Pap/HPV done. 3. History of ovarian cyst.  Patient had left ovary with echo-free thin-walled avascular cyst 42 x 31 x 33 mm (35 mm mean) July 2014 on ultrasound that was performed for irregular bleeding with Mirena IUD. Followup ultrasound 12/2012 showed resolution of the cyst. Patient is very concerned that there might be a recurrence and I reassured her about the physiologic nature of these changes. Regardless she requests a followup ultrasound to make sure there is no recurrence but is comfortable waiting several months and will schedule one in 3 months for ovarian surveillance. This will also give [place] a chance to followup on her menstrual cycle following removal of her Mirena IUD. 4. Vulvar mole. Patient has a very benign small appearing mole right mid labia majora. Patient notes its been present for several months and she never noticed it previously. Recommended observation and followup if it changes at all for excision. This is versus excision now. Patient's comfortable with observation at this point. 5. Mammography 11/2012. Continue with annual mammography. SBE monthly reviewed. 6. Colonoscopy 2013. Repeat at their recommended interval. 7. Health maintenance.  Reports routine blood work done through her other physician's offices. We'll check urinalysis. Followup for ultrasound and menstrual calendar followup in 3 months.   Note: This document was prepared with digital dictation and possible smart phrase technology. Any  transcriptional errors that result from this process are unintentional.   Dara Lords MD, 4:53 PM 03/10/2013

## 2013-03-10 NOTE — Patient Instructions (Signed)
Followup in 3 months for ultrasound. Followup if the vulvar mole changes at all.

## 2013-05-17 ENCOUNTER — Other Ambulatory Visit: Payer: Self-pay | Admitting: Family Medicine

## 2013-05-25 ENCOUNTER — Ambulatory Visit: Payer: BC Managed Care – PPO | Admitting: Gynecology

## 2013-05-25 ENCOUNTER — Telehealth: Payer: Self-pay | Admitting: *Deleted

## 2013-05-25 NOTE — Telephone Encounter (Signed)
Pt called c/o what she thought was a mole, had appt. Scheduled for today, pt said mole is now gone and would like to cancel. i call pt back and left on voicemail okay to do this

## 2013-06-07 ENCOUNTER — Telehealth: Payer: Self-pay

## 2013-06-07 NOTE — Telephone Encounter (Signed)
Patient informed. Note forwarded to Abigail Butts to check ins benefits please and patient knows she will hear about that next.

## 2013-06-07 NOTE — Telephone Encounter (Signed)
Sure, it is okay if she gets it now for menstrual regulation. It certainly should see her through the menopause

## 2013-06-07 NOTE — Telephone Encounter (Signed)
Patient said she had Mirena removed in November.  Since removed her last 3 periods have been extremely heavy. She said when you checked in Nov she was not menopausal but realizes that might occur during the next five years while Mirena in.  She asked if Mirena acceptable for her to get now?

## 2013-06-08 ENCOUNTER — Ambulatory Visit: Payer: BC Managed Care – PPO | Admitting: Gynecology

## 2013-06-08 ENCOUNTER — Other Ambulatory Visit: Payer: BC Managed Care – PPO

## 2013-06-09 ENCOUNTER — Telehealth: Payer: Self-pay | Admitting: Gynecology

## 2013-06-09 ENCOUNTER — Other Ambulatory Visit: Payer: BC Managed Care – PPO

## 2013-06-09 ENCOUNTER — Ambulatory Visit: Payer: BC Managed Care – PPO | Admitting: Gynecology

## 2013-06-09 ENCOUNTER — Other Ambulatory Visit: Payer: Self-pay | Admitting: Gynecology

## 2013-06-09 DIAGNOSIS — Z3049 Encounter for surveillance of other contraceptives: Secondary | ICD-10-CM

## 2013-06-09 NOTE — Telephone Encounter (Signed)
06/09/13-LM on pt cell phone that her insurance covers the Mirena & insertion at 100%, no pt responsibility. Pt to call first day of cycle. WL

## 2013-06-22 ENCOUNTER — Ambulatory Visit (INDEPENDENT_AMBULATORY_CARE_PROVIDER_SITE_OTHER): Payer: BC Managed Care – PPO

## 2013-06-22 ENCOUNTER — Other Ambulatory Visit: Payer: Self-pay | Admitting: Gynecology

## 2013-06-22 ENCOUNTER — Ambulatory Visit (INDEPENDENT_AMBULATORY_CARE_PROVIDER_SITE_OTHER): Payer: BC Managed Care – PPO | Admitting: Gynecology

## 2013-06-22 ENCOUNTER — Encounter: Payer: Self-pay | Admitting: Gynecology

## 2013-06-22 DIAGNOSIS — N920 Excessive and frequent menstruation with regular cycle: Secondary | ICD-10-CM

## 2013-06-22 DIAGNOSIS — N831 Corpus luteum cyst of ovary, unspecified side: Secondary | ICD-10-CM

## 2013-06-22 DIAGNOSIS — N83209 Unspecified ovarian cyst, unspecified side: Secondary | ICD-10-CM

## 2013-06-22 NOTE — Patient Instructions (Signed)
Followup for IUD placement as we discussed.

## 2013-06-22 NOTE — Progress Notes (Signed)
Melanie Valenzuela July 16, 1960 [id number]        52 y.o.  E3P2951 patient presents for followup ultrasound. Has a history of physiologic ovarian cystic changes in the past with last ultrasound normal. She is very anxious about the possibilities of ovarian tumors and asked if she could have a followup ultrasound to make sure everything is all right. She does note that her menses have been heavy for the last several cycles since having her Mirena IUD placed and she is contemplating having another Mirena IUD placed.  Past medical history,surgical history, problem list, medications, allergies, family history and social history were all reviewed and documented in the EPIC chart.  Ultrasound shows uterus normal size and echotexture. Has some cortical cystic areas. Right ovary normal. Left ovary with thick walled cystic solid mass peripheral flow consistent with corpus luteum. Cul-de-sac with free fluid noted.  Assessment/Plan:  53 y.o. G2P2002 followup ultrasound shows classic-appearing corpus luteum in the left ovary which would go along with where she is in her cycle entering her third week. She continues to have heavy menses after having her Mirena IUD removed and she is going to arrange to have this replaced with her next menses. We discussed the pros and cons followup ultrasound and at this point we are both comfortable with waiting given the history and corpus luteal appearance of the left ovarian cyst.   Note: This document was prepared with digital dictation and possible smart phrase technology. Any transcriptional errors that result from this process are unintentional.   [name] MD, 3:56 PM 06/22/2013

## 2013-06-27 ENCOUNTER — Ambulatory Visit: Payer: BC Managed Care – PPO | Admitting: Gynecology

## 2013-06-29 ENCOUNTER — Encounter: Payer: Self-pay | Admitting: Gynecology

## 2013-06-29 ENCOUNTER — Ambulatory Visit (INDEPENDENT_AMBULATORY_CARE_PROVIDER_SITE_OTHER): Payer: BC Managed Care – PPO | Admitting: Gynecology

## 2013-06-29 DIAGNOSIS — Z30430 Encounter for insertion of intrauterine contraceptive device: Secondary | ICD-10-CM

## 2013-06-29 HISTORY — PX: INTRAUTERINE DEVICE INSERTION: SHX323

## 2013-06-29 NOTE — Progress Notes (Signed)
Patient presents for Mirena IUD placement. She has read through the booklet, has no contraindications and signed the consent form. She currently is on a normal menses.  I reviewed the insertional process with her as well as the risks to include infection, either immediate or long-term, uterine perforation or migration requiring surgery to remove, other complications such as pain, hormonal side effects, infertility and possibility of failure with subsequent pregnancy.   Exam with Kim assistant Pelvic: External BUS vagina normal. Cervix normal with menses flow. Uterus anteverted to axial, normal size shape contour midline mobile nontender. Adnexa without masses or tenderness.  Procedure: The cervix was cleansed with Betadine, anterior lip grasped with a single-tooth tenaculum, the uterus was sounded and a Mirena IUD was placed according to manufacturer's recommendations without difficulty. The strings were trimmed. The patient tolerated well and will follow up in one month for a postinsertional check.  Lot number:  [id number]  Note: This document was prepared with digital dictation and possible smart phrase technology. Any transcriptional errors that result from this process are unintentional.  [name] MD, 4:13 PM 06/29/2013

## 2013-06-29 NOTE — Patient Instructions (Signed)
Intrauterine Device Insertion   Most often, an intrauterine device (IUD) is inserted into the uterus to prevent pregnancy. There are 2 types of IUDs available:  · Copper IUD This type of IUD creates an environment that is not favorable to sperm survival. The mechanism of action of the copper IUD is not known for certain. It can stay in place for 10 years.  · Hormone IUD This type of IUD contains the hormone progestin (synthetic progesterone). The progestin thickens the cervical mucus and prevents sperm from entering the uterus, and it also thins the uterine lining. There is no evidence that the hormone IUD prevents implantation. One hormone IUD can stay in place for up to 5 years, and a different hormone IUD can stay in place for up to 3 years.  An IUD is the most cost-effective birth control if left in place for the full duration. It may be removed at any time.  LET YOUR HEALTH CARE PROVIDER KNOW ABOUT:  · Any allergies you have.  · All medicines you are taking, including vitamins, herbs, eye drops, creams, and over-the-counter medicines.  · Previous problems you or members of your family have had with the use of anesthetics.  · Any blood disorders you have.  · Previous surgeries you have had.  · Possibility of pregnancy.  · Medical conditions you have.  RISKS AND COMPLICATIONS   Generally, intrauterine device insertion is a safe procedure. However, as with any procedure, complications can occur. Possible complications include:  · Accidental puncture (perforation) of the uterus.  · Accidental placement of the IUD either in the muscle layer of the uterus (myometrium) or outside the uterus. If this happens, the IUD can be found essentially floating around the bowels and must be taken out surgically.  · The IUD may fall out of the uterus (expulsion). This is more common in women who have recently had a child.    · Pregnancy in the fallopian tube (ectopic).  · Pelvic inflammatory disease (PID), which is infection of  the uterus and fallopian tubes. The risk of PID is slightly increased in the first 20 days after the IUD is placed, but the overall risk is still very low.  BEFORE THE PROCEDURE  · Schedule the IUD insertion for when you will have your menstrual period or right after, to make sure you are not pregnant. Placement of the IUD is better tolerated shortly after a menstrual cycle.  · You may need to take tests or be examined to make sure you are not pregnant.  · You may be required to take a pregnancy test.  · You may be required to get checked for sexually transmitted infections (STIs) prior to placement. Placing an IUD in someone who has an infection can make the infection worse.  · You may be given a pain reliever to take 1 or 2 hours before the procedure.  · An exam will be performed to determine the size and position of your uterus.  · Ask your health care provider about changing or stopping your regular medicines.  PROCEDURE   · A tool (speculum) is placed in the vagina. This allows your health care provider to see the lower part of the uterus (cervix).  · The cervix is prepped with a medicine that lowers the risk of infection.  · You may be given a medicine to numb each side of the cervix (intracervical or paracervical block). This is used to block and control any discomfort with insertion.  · A tool (  uterine sound) is inserted into the uterus to determine the length of the uterine cavity and the direction the uterus may be tilted.  · A slim instrument (IUD inserter) is inserted through the cervical canal and into your uterus.  · The IUD is placed in the uterine cavity and the insertion device is removed.  · The nylon string that is attached to the IUD and used for eventual IUD removal is trimmed. It is trimmed so that it lays high in the vagina, just outside the cervix.  AFTER THE PROCEDURE  · You may have bleeding after the procedure. This is normal. It varies from light spotting for a few days to menstrual-like  bleeding.   · You may have mild cramping.  Document Released: 12/03/2010 Document Revised: 01/25/2013 Document Reviewed: 09/25/2012  [employer]® Patient Information ©2014 [employer].

## 2013-06-30 ENCOUNTER — Encounter: Payer: Self-pay | Admitting: Gynecology

## 2013-07-05 ENCOUNTER — Ambulatory Visit: Payer: BC Managed Care – PPO | Admitting: Gynecology

## 2013-07-05 ENCOUNTER — Other Ambulatory Visit: Payer: BC Managed Care – PPO

## 2013-08-01 ENCOUNTER — Ambulatory Visit (INDEPENDENT_AMBULATORY_CARE_PROVIDER_SITE_OTHER): Payer: BC Managed Care – PPO | Admitting: Gynecology

## 2013-08-01 ENCOUNTER — Encounter: Payer: Self-pay | Admitting: Gynecology

## 2013-08-01 DIAGNOSIS — Z30431 Encounter for routine checking of intrauterine contraceptive device: Secondary | ICD-10-CM

## 2013-08-01 DIAGNOSIS — N926 Irregular menstruation, unspecified: Secondary | ICD-10-CM

## 2013-08-01 DIAGNOSIS — T839XXA Unspecified complication of genitourinary prosthetic device, implant and graft, initial encounter: Secondary | ICD-10-CM

## 2013-08-01 MED ORDER — MEGESTROL ACETATE 20 MG PO TABS: 20.0000 mg | ORAL_TABLET | Freq: Every day | ORAL | Status: DC

## 2013-08-01 NOTE — Progress Notes (Signed)
Ignacia Gentzler Ragone 1960-05-16 [id number]        53 y.o.  G3O7564 patient presents for her one-month followup Mirena IUD placement check. Notes that she has been spotting on and off almost daily since placement. Also notes that she has had some swelling in her right leg before her last menses which resolved once her menses started. Patient also complaining of some bilateral breast tenderness since the Mirena IUD placement.  Past medical history,surgical history, problem list, medications, allergies, family history and social history were all reviewed and documented in the EPIC chart.  Exam: Kim assistant General appearance  Normal Abdomen soft nontender without masses guarding rebound Pelvic external BUS vagina with dark staining. Cervix normal. IUD strings visualized and normal length. Uterus normal size midline mobile nontender. Adnexa without masses or tenderness. Right extremity without evidence of swelling tenderness cords  Assessment/Plan:  53 y.o. G2P2002 spotting on and off with IUD. Some hormonal systemic effects. Swelling in her leg apparently is a recurrent phenomenon before her menses.  Has resolved without overt evidence of DVT. Recommend checking baseline FSH TSH and Megace 20 mg daily x10 days. If irregular bleeding continues will represent for evaluation. If resolves and does well then we'll follow. She is well where the signs and symptoms of DVT and knows to report any persistent swelling or discomfort.   Note: This document was prepared with digital dictation and possible smart phrase technology. Any transcriptional errors that result from this process are unintentional.   [name] MD, 4:50 PM 08/01/2013

## 2013-08-01 NOTE — Patient Instructions (Signed)
Take the Megace medication daily for 10 days. Call me if your irregular bleeding continues. Office will call you with your blood results.

## 2013-08-02 LAB — FOLLICLE STIMULATING HORMONE: FSH: 2.5 m[IU]/mL

## 2013-08-02 LAB — TSH: TSH: 2.64 u[IU]/mL (ref 0.350–4.500)

## 2013-08-14 ENCOUNTER — Telehealth: Payer: Self-pay | Admitting: *Deleted

## 2013-08-14 NOTE — Telephone Encounter (Signed)
Pt called with questions about the megace 20 mg tablet prescribed on 08/01/13. All questions answered.

## 2013-11-07 ENCOUNTER — Other Ambulatory Visit: Payer: Self-pay | Admitting: Physician Assistant

## 2013-12-29 ENCOUNTER — Other Ambulatory Visit: Payer: Self-pay

## 2013-12-29 DIAGNOSIS — Z1231 Encounter for screening mammogram for malignant neoplasm of breast: Secondary | ICD-10-CM

## 2014-02-12 ENCOUNTER — Ambulatory Visit
Admission: RE | Admit: 2014-02-12 | Discharge: 2014-02-12 | Disposition: A | Discharge status: Home / Self Care | Payer: BC Managed Care – PPO | Source: Ambulatory Visit

## 2014-02-12 DIAGNOSIS — Z1231 Encounter for screening mammogram for malignant neoplasm of breast: Secondary | ICD-10-CM

## 2014-02-19 ENCOUNTER — Encounter: Payer: Self-pay | Admitting: Gynecology

## 2014-03-14 ENCOUNTER — Encounter: Payer: BC Managed Care – PPO | Admitting: Gynecology

## 2014-04-27 ENCOUNTER — Ambulatory Visit (INDEPENDENT_AMBULATORY_CARE_PROVIDER_SITE_OTHER): Payer: BLUE CROSS/BLUE SHIELD | Admitting: Gynecology

## 2014-04-27 ENCOUNTER — Other Ambulatory Visit (HOSPITAL_COMMUNITY)
Admission: RE | Admit: 2014-04-27 | Discharge: 2014-04-27 | Disposition: A | Discharge status: Home / Self Care | Payer: BLUE CROSS/BLUE SHIELD | Source: Ambulatory Visit | Attending: Gynecology | Admitting: Gynecology

## 2014-04-27 ENCOUNTER — Encounter: Payer: Self-pay | Admitting: Gynecology

## 2014-04-27 VITALS — BP 120/76 | Ht 63.0 in | Wt 130.0 lb

## 2014-04-27 DIAGNOSIS — R5382 Chronic fatigue, unspecified: Secondary | ICD-10-CM

## 2014-04-27 DIAGNOSIS — Z1151 Encounter for screening for human papillomavirus (HPV): Secondary | ICD-10-CM | POA: Insufficient documentation

## 2014-04-27 DIAGNOSIS — Z01419 Encounter for gynecological examination (general) (routine) without abnormal findings: Secondary | ICD-10-CM

## 2014-04-27 DIAGNOSIS — Z30431 Encounter for routine checking of intrauterine contraceptive device: Secondary | ICD-10-CM

## 2014-04-27 LAB — COMPREHENSIVE METABOLIC PANEL
ALK PHOS: 57 U/L (ref 39–117)
ALT: 14 U/L (ref 0–35)
AST: 21 U/L (ref 0–37)
Albumin: 4.5 g/dL (ref 3.5–5.2)
BUN: 16 mg/dL (ref 6–23)
CALCIUM: 9.1 mg/dL (ref 8.4–10.5)
CHLORIDE: 103 meq/L (ref 96–112)
CO2: 26 meq/L (ref 19–32)
Creat: 0.85 mg/dL (ref 0.50–1.10)
GLUCOSE: 80 mg/dL (ref 70–99)
POTASSIUM: 3.6 meq/L (ref 3.5–5.3)
Sodium: 139 mEq/L (ref 135–145)
Total Bilirubin: 0.5 mg/dL (ref 0.2–1.2)
Total Protein: 7 g/dL (ref 6.0–8.3)

## 2014-04-27 LAB — LIPID PANEL
Cholesterol: 162 mg/dL (ref 0–200)
HDL: 59 mg/dL (ref >39)
LDL Cholesterol: 85 mg/dL (ref 0–99)
Total CHOL/HDL Ratio: 2.7 Ratio
Triglycerides: 92 mg/dL (ref <150)
VLDL: 18 mg/dL (ref 0–40)

## 2014-04-27 MED ORDER — SUMATRIPTAN SUCCINATE 100 MG PO TABS: 100.0000 mg | ORAL_TABLET | ORAL | Status: DC | PRN

## 2014-04-27 NOTE — Patient Instructions (Signed)
You may obtain a copy of any labs that were done today by logging onto MyChart as outlined in the instructions provided with your AVS (after visit summary). The office will not call with normal lab results but certainly if there are any significant abnormalities then we will contact you.   Health Maintenance, Female A healthy lifestyle and preventative care can promote health and wellness.  Maintain regular health, dental, and eye exams.  Eat a healthy diet. Foods like vegetables, fruits, whole grains, low-fat dairy products, and lean protein foods contain the nutrients you need without too many calories. Decrease your intake of foods high in solid fats, added sugars, and salt. Get information about a proper diet from your caregiver, if necessary.  Regular physical exercise is one of the most important things you can do for your health. Most adults should get at least 150 minutes of moderate-intensity exercise (any activity that increases your heart rate and causes you to sweat) each week. In addition, most adults need muscle-strengthening exercises on 2 or more days a week.   Maintain a healthy weight. The body mass index (BMI) is a screening tool to identify possible weight problems. It provides an estimate of body fat based on height and weight. Your caregiver can help determine your BMI, and can help you achieve or maintain a healthy weight. For adults 20 years and older:  A BMI below 18.5 is considered underweight.  A BMI of 18.5 to 24.9 is normal.  A BMI of 25 to 29.9 is considered overweight.  A BMI of 30 and above is considered obese.  Maintain normal blood lipids and cholesterol by exercising and minimizing your intake of saturated fat. Eat a balanced diet with plenty of fruits and vegetables. Blood tests for lipids and cholesterol should begin at age 61 and be repeated every 5 years. If your lipid or cholesterol levels are high, you are over 50, or you are a high risk for heart  disease, you may need your cholesterol levels checked more frequently.Ongoing high lipid and cholesterol levels should be treated with medicines if diet and exercise are not effective.  If you smoke, find out from your caregiver how to quit. If you do not use tobacco, do not start.  Lung cancer screening is recommended for adults aged 33 80 years who are at high risk for developing lung cancer because of a history of smoking. Yearly low-dose computed tomography (CT) is recommended for people who have at least a 30-pack-year history of smoking and are a current smoker or have quit within the past 15 years. A pack year of smoking is smoking an average of 1 pack of cigarettes a day for 1 year (for example: 1 pack a day for 30 years or 2 packs a day for 15 years). Yearly screening should continue until the smoker has stopped smoking for at least 15 years. Yearly screening should also be stopped for people who develop a health problem that would prevent them from having lung cancer treatment.  If you are pregnant, do not drink alcohol. If you are breastfeeding, be very cautious about drinking alcohol. If you are not pregnant and choose to drink alcohol, do not exceed 1 drink per day. One drink is considered to be 12 ounces (355 mL) of beer, 5 ounces (148 mL) of wine, or 1.5 ounces (44 mL) of liquor.  Avoid use of street drugs. Do not share needles with anyone. Ask for help if you need support or instructions about stopping  the use of drugs.  High blood pressure causes heart disease and increases the risk of stroke. Blood pressure should be checked at least every 1 to 2 years. Ongoing high blood pressure should be treated with medicines, if weight loss and exercise are not effective.  If you are 59 to 53 years old, ask your caregiver if you should take aspirin to prevent strokes.  Diabetes screening involves taking a blood sample to check your fasting blood sugar level. This should be done once every 3  years, after age 91, if you are within normal weight and without risk factors for diabetes. Testing should be considered at a younger age or be carried out more frequently if you are overweight and have at least 1 risk factor for diabetes.  Breast cancer screening is essential preventative care for women. You should practice "breast self-awareness." This means understanding the normal appearance and feel of your breasts and may include breast self-examination. Any changes detected, no matter how small, should be reported to a caregiver. Women in their 66s and 30s should have a clinical breast exam (CBE) by a caregiver as part of a regular health exam every 1 to 3 years. After age 101, women should have a CBE every year. Starting at age 100, women should consider having a mammogram (breast X-ray) every year. Women who have a family history of breast cancer should talk to their caregiver about genetic screening. Women at a high risk of breast cancer should talk to their caregiver about having an MRI and a mammogram every year.  Breast cancer gene (BRCA)-related cancer risk assessment is recommended for women who have family members with BRCA-related cancers. BRCA-related cancers include breast, ovarian, tubal, and peritoneal cancers. Having family members with these cancers may be associated with an increased risk for harmful changes (mutations) in the breast cancer genes BRCA1 and BRCA2. Results of the assessment will determine the need for genetic counseling and BRCA1 and BRCA2 testing.  The Pap test is a screening test for cervical cancer. Women should have a Pap test starting at age 57. Between ages 25 and 35, Pap tests should be repeated every 2 years. Beginning at age 37, you should have a Pap test every 3 years as long as the past 3 Pap tests have been normal. If you had a hysterectomy for a problem that was not cancer or a condition that could lead to cancer, then you no longer need Pap tests. If you are  between ages 50 and 76, and you have had normal Pap tests going back 10 years, you no longer need Pap tests. If you have had past treatment for cervical cancer or a condition that could lead to cancer, you need Pap tests and screening for cancer for at least 20 years after your treatment. If Pap tests have been discontinued, risk factors (such as a new sexual partner) need to be reassessed to determine if screening should be resumed. Some women have medical problems that increase the chance of getting cervical cancer. In these cases, your caregiver may recommend more frequent screening and Pap tests.  The human papillomavirus (HPV) test is an additional test that may be used for cervical cancer screening. The HPV test looks for the virus that can cause the cell changes on the cervix. The cells collected during the Pap test can be tested for HPV. The HPV test could be used to screen women aged 44 years and older, and should be used in women of any age  who have unclear Pap test results. After the age of 55, women should have HPV testing at the same frequency as a Pap test.  Colorectal cancer can be detected and often prevented. Most routine colorectal cancer screening begins at the age of 44 and continues through age 20. However, your caregiver may recommend screening at an earlier age if you have risk factors for colon cancer. On a yearly basis, your caregiver may provide home test kits to check for hidden blood in the stool. Use of a small camera at the end of a tube, to directly examine the colon (sigmoidoscopy or colonoscopy), can detect the earliest forms of colorectal cancer. Talk to your caregiver about this at age 86, when routine screening begins. Direct examination of the colon should be repeated every 5 to 10 years through age 13, unless early forms of pre-cancerous polyps or small growths are found.  Hepatitis C blood testing is recommended for all people born from [birth year] through 1964 and any  individual with known risks for hepatitis C.  Practice safe sex. Use condoms and avoid high-risk sexual practices to reduce the spread of sexually transmitted infections (STIs). Sexually active women aged 36 and younger should be checked for Chlamydia, which is a common sexually transmitted infection. Older women with new or multiple partners should also be tested for Chlamydia. Testing for other STIs is recommended if you are sexually active and at increased risk.  Osteoporosis is a disease in which the bones lose minerals and strength with aging. This can result in serious bone fractures. The risk of osteoporosis can be identified using a bone density scan. Women ages 20 and over and women at risk for fractures or osteoporosis should discuss screening with their caregivers. Ask your caregiver whether you should be taking a calcium supplement or vitamin D to reduce the rate of osteoporosis.  Menopause can be associated with physical symptoms and risks. Hormone replacement therapy is available to decrease symptoms and risks. You should talk to your caregiver about whether hormone replacement therapy is right for you.  Use sunscreen. Apply sunscreen liberally and repeatedly throughout the day. You should seek shade when your shadow is shorter than you. Protect yourself by wearing long sleeves, pants, a wide-brimmed hat, and sunglasses year round, whenever you are outdoors.  Notify your caregiver of new moles or changes in moles, especially if there is a change in shape or color. Also notify your caregiver if a mole is larger than the size of a pencil eraser.  Stay current with your immunizations. Document Released: 10/20/2010 Document Revised: 08/01/2012 Document Reviewed: 10/20/2010 [facility] Patient Information 2014 [employer].

## 2014-04-27 NOTE — Progress Notes (Signed)
Melanie Valenzuela 10-10-1960 [id number]        53 y.o.  G2P2002 for annual exam.  Several issues noted below.  Past medical history,surgical history, problem list, medications, allergies, family history and social history were all reviewed and documented as reviewed in the EPIC chart.  ROS:  Performed with pertinent positives and negatives included in the history, assessment and plan.   Additional significant findings :  none   Exam: Kim Counsellor Vitals:   04/27/14 1517  BP: 120/76  Height: 5\' 3"  (1.6 m)  Weight: 130 lb (58.968 kg)   General appearance:  Normal affect, orientation and appearance. Skin: Grossly normal HEENT: Without gross lesions.  No cervical or supraclavicular adenopathy. Thyroid normal.  Lungs:  Clear without wheezing, rales or rhonchi Cardiac: RR, without RMG Abdominal:  Soft, nontender, without masses, guarding, rebound, organomegaly or hernia Breasts:  Examined lying and sitting without masses, retractions, discharge or axillary adenopathy. Pelvic:  Ext/BUS/vagina normal  Cervix normal. IUD string visualized. Pap/HPV  Uterus anteverted, normal size, shape and contour, midline and mobile nontender   Adnexa  Without masses or tenderness    Anus and perineum  Normal   Rectovaginal  Normal sphincter tone without palpated masses or tenderness.    Assessment/Plan:  54 y.o. G89P2002 female for annual exam with regular menses, Mirena IUD.   1. Mirena IUD 06/2013. Was without menses for several months but now is having regular monthly menses. Doing well without significant symptoms to suggest menopause. IUD strings visualized. 2. Fatigue. Patient notes feeling more tired this past year despite exercise. No significant weight change hair or skin changes. Check baseline CBC comprehensive metabolic panel TSH and FSH. Plan primary M.D. Evaluation if continues. 3. Pap/HPV negative 2014.  Pap/HPV done today. 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  Ascus with negative high-risk HPV 2012. Negative Pap smears since. Patients very nervous with less frequent screening interval recommendations and would prefer annual HPV/cytology. 4. Mammography 01/2014. Continue with annual mammography.  SBE monthly. 5. Colonoscopy 2014. Repeat at their recommended interval. 6. Occasional migraines. Uses eye Mytrex 100 mg with good results. Refill #10 with no refills provided 7. Health maintenance. Baseline CBC comprehensive metabolic panel lipid profile urinalysis TSH FSH vitamin D ordered. Follow up in one year, sooner as needed.     [name] MD, 3:43 PM 04/27/2014

## 2014-04-27 NOTE — Addendum Note (Signed)
Addended by: Nelva Nay on: 04/27/2014 03:57 PM   Modules accepted: Orders, SmartSet

## 2014-04-28 LAB — CBC WITH DIFFERENTIAL/PLATELET
BASOS ABS: 0 10*3/uL (ref 0.0–0.1)
Basophils Relative: 0 % (ref 0–1)
Eosinophils Absolute: 0.1 10*3/uL (ref 0.0–0.7)
Eosinophils Relative: 3 % (ref 0–5)
HEMATOCRIT: 39.2 % (ref 36.0–46.0)
Hemoglobin: 13 g/dL (ref 12.0–15.0)
LYMPHS ABS: 1.3 10*3/uL (ref 0.7–4.0)
Lymphocytes Relative: 26 % (ref 12–46)
MCH: 30.8 pg (ref 26.0–34.0)
MCHC: 33.2 g/dL (ref 30.0–36.0)
MCV: 92.9 fL (ref 78.0–100.0)
MPV: 10.4 fL (ref 8.6–12.4)
Monocytes Absolute: 0.3 10*3/uL (ref 0.1–1.0)
Monocytes Relative: 7 % (ref 3–12)
NEUTROS PCT: 64 % (ref 43–77)
Neutro Abs: 3.1 10*3/uL (ref 1.7–7.7)
Platelets: 207 10*3/uL (ref 150–400)
RBC: 4.22 MIL/uL (ref 3.87–5.11)
RDW: 12.6 % (ref 11.5–15.5)
WBC: 4.9 10*3/uL (ref 4.0–10.5)

## 2014-04-28 LAB — URINALYSIS W MICROSCOPIC + REFLEX CULTURE
BACTERIA UA: NONE SEEN
Bilirubin Urine: NEGATIVE
CASTS: NONE SEEN
CRYSTALS: NONE SEEN
Glucose, UA: NEGATIVE mg/dL
HGB URINE DIPSTICK: NEGATIVE
KETONES UR: NEGATIVE mg/dL
Leukocytes, UA: NEGATIVE
Nitrite: NEGATIVE
Protein, ur: NEGATIVE mg/dL
Specific Gravity, Urine: 1.029 (ref 1.005–1.030)
Squamous Epithelial / LPF: NONE SEEN
Urobilinogen, UA: 0.2 mg/dL (ref 0.0–1.0)
pH: 5 (ref 5.0–8.0)

## 2014-04-28 LAB — VITAMIN D 25 HYDROXY (VIT D DEFICIENCY, FRACTURES): VIT D 25 HYDROXY: 31 ng/mL (ref 30–100)

## 2014-04-28 LAB — FOLLICLE STIMULATING HORMONE: FSH: 55.3 m[IU]/mL

## 2014-04-28 LAB — TSH: TSH: 1.951 u[IU]/mL (ref 0.350–4.500)

## 2014-05-01 LAB — CYTOLOGY - PAP

## 2014-05-16 ENCOUNTER — Telehealth: Payer: Self-pay | Admitting: *Deleted

## 2014-05-16 NOTE — Telephone Encounter (Signed)
Pt called requesting Pap results from [place] 04/27/14 left detailed message on pt voicemail with negative results.

## 2014-11-16 ENCOUNTER — Encounter: Payer: Self-pay | Admitting: Gynecology

## 2014-11-16 ENCOUNTER — Ambulatory Visit (INDEPENDENT_AMBULATORY_CARE_PROVIDER_SITE_OTHER): Payer: BLUE CROSS/BLUE SHIELD | Admitting: Gynecology

## 2014-11-16 VITALS — BP 120/76

## 2014-11-16 DIAGNOSIS — N926 Irregular menstruation, unspecified: Secondary | ICD-10-CM

## 2014-11-16 NOTE — Patient Instructions (Signed)
Follow up for ultrasound as scheduled 

## 2014-11-16 NOTE — Progress Notes (Signed)
Melanie Valenzuela May 19, 1960 [id number]        54 y.o.  [id number] Presents complaining of irregular bleeding with IUD.  Had IUD inserted 06/2013. Has had sporadic menses where she will have no bleeding for 2 months or so and then have regular menses monthly for several months in a row. The last month though she has been having some sporadic bleeding on and off with some dark staining almost every day. No significant discomfort or cramping. No fever chills or other symptoms.  Past medical history,surgical history, problem list, medications, allergies, family history and social history were all reviewed and documented in the EPIC chart.  Directed ROS with pertinent positives and negatives documented in the history of present illness/assessment and plan.  Exam: Kim assistant Filed Vitals:   11/16/14 1208  BP: 120/76   General appearance:  Normal Abdomen soft nontender without masses guarding rebound Pelvic external BUS vagina normal. Cervix normal with IUD string visualized at external os. Uterus axial to anteverted normal size midline mobile nontender. Adnexa without masses or tenderness.  Assessment/Plan:  54 y.o. [id number]  With dysfunctional bleeding with IUD.  Lab work beginning of this year showed an Reading of 102 and a normal TSH. Recommend baseline ultrasound now rule out gross pelvic pathology. Possible progesterone challenge discussed. Will await ultrasound results and then discuss further options.    [name] MD, 12:34 PM 11/16/2014

## 2014-11-22 ENCOUNTER — Other Ambulatory Visit: Payer: Self-pay | Admitting: Physician Assistant

## 2014-11-27 ENCOUNTER — Ambulatory Visit (INDEPENDENT_AMBULATORY_CARE_PROVIDER_SITE_OTHER): Payer: BLUE CROSS/BLUE SHIELD | Admitting: Gynecology

## 2014-11-27 ENCOUNTER — Ambulatory Visit (INDEPENDENT_AMBULATORY_CARE_PROVIDER_SITE_OTHER): Payer: BLUE CROSS/BLUE SHIELD

## 2014-11-27 ENCOUNTER — Other Ambulatory Visit: Payer: Self-pay | Admitting: Gynecology

## 2014-11-27 ENCOUNTER — Encounter: Payer: Self-pay | Admitting: Gynecology

## 2014-11-27 VITALS — BP 112/76

## 2014-11-27 DIAGNOSIS — N921 Excessive and frequent menstruation with irregular cycle: Secondary | ICD-10-CM

## 2014-11-27 DIAGNOSIS — N926 Irregular menstruation, unspecified: Secondary | ICD-10-CM

## 2014-11-27 DIAGNOSIS — Z975 Presence of (intrauterine) contraceptive device: Secondary | ICD-10-CM

## 2014-11-27 NOTE — Patient Instructions (Signed)
Call if irregular bleeding continues, otherwise follow up in January when you are due for your annual exam.

## 2014-11-27 NOTE — Progress Notes (Signed)
Melanie Valenzuela 01-05-1961 [id number]        54 y.o.  [id number] Presents for ultrasound. History of Mirena IUD since 06/2013. Was having sporadic menses. Over the past month though started spotting almost daily. No pain or other symptoms.  Past medical history,surgical history, problem list, medications, allergies, family history and social history were all reviewed and documented in the EPIC chart.  Directed ROS with pertinent positives and negatives documented in the history of present illness/assessment and plan.  Exam: Filed Vitals:   11/27/14 1623  BP: 112/76   General appearance:  Normal  Ultrasound shows uterus normal size and echotexture. Endometrial echo 2.2 mm. IUD visualized within normal position within the uterus. Right and left ovaries are visualized and normal. Cul-de-sac negative  Assessment/Plan:  54 y.o. [id number] with normal ultrasound. Sporadic bleeding with IUD. Options to include expectant, trial of low-dose estrogen supplementation, trial of low dose progesterone supplementation, remove IUD discussed. At this point the patient prefers just to watch. If she continues to bleed she is going to call and we'll go with a trial of Estrace 1 mg daily to see if we can't stabilize the endometrium.    [name] MD, 4:36 PM 11/27/2014

## 2015-01-29 ENCOUNTER — Other Ambulatory Visit: Payer: Self-pay

## 2015-01-29 DIAGNOSIS — Z1231 Encounter for screening mammogram for malignant neoplasm of breast: Secondary | ICD-10-CM

## 2015-02-26 ENCOUNTER — Ambulatory Visit
Admission: RE | Admit: 2015-02-26 | Discharge: 2015-02-26 | Disposition: A | Discharge status: Home / Self Care | Payer: BLUE CROSS/BLUE SHIELD | Source: Ambulatory Visit

## 2015-02-26 DIAGNOSIS — Z1231 Encounter for screening mammogram for malignant neoplasm of breast: Secondary | ICD-10-CM

## 2015-05-03 ENCOUNTER — Ambulatory Visit (INDEPENDENT_AMBULATORY_CARE_PROVIDER_SITE_OTHER): Payer: BLUE CROSS/BLUE SHIELD | Admitting: Gynecology

## 2015-05-03 ENCOUNTER — Encounter: Payer: Self-pay | Admitting: Gynecology

## 2015-05-03 ENCOUNTER — Other Ambulatory Visit (HOSPITAL_COMMUNITY)
Admission: RE | Admit: 2015-05-03 | Discharge: 2015-05-03 | Disposition: A | Discharge status: Home / Self Care | Payer: BLUE CROSS/BLUE SHIELD | Source: Ambulatory Visit | Attending: Gynecology | Admitting: Gynecology

## 2015-05-03 VITALS — BP 120/76 | Ht 61.5 in | Wt 126.0 lb

## 2015-05-03 DIAGNOSIS — F411 Generalized anxiety disorder: Secondary | ICD-10-CM | POA: Diagnosis not present

## 2015-05-03 DIAGNOSIS — Z01419 Encounter for gynecological examination (general) (routine) without abnormal findings: Secondary | ICD-10-CM | POA: Insufficient documentation

## 2015-05-03 DIAGNOSIS — Z30431 Encounter for routine checking of intrauterine contraceptive device: Secondary | ICD-10-CM | POA: Diagnosis not present

## 2015-05-03 DIAGNOSIS — Z13220 Encounter for screening for lipoid disorders: Secondary | ICD-10-CM | POA: Diagnosis not present

## 2015-05-03 DIAGNOSIS — Z1151 Encounter for screening for human papillomavirus (HPV): Secondary | ICD-10-CM | POA: Insufficient documentation

## 2015-05-03 DIAGNOSIS — Z1321 Encounter for screening for nutritional disorder: Secondary | ICD-10-CM | POA: Diagnosis not present

## 2015-05-03 LAB — COMPREHENSIVE METABOLIC PANEL
ALBUMIN: 4.3 g/dL (ref 3.6–5.1)
ALK PHOS: 56 U/L (ref 33–130)
ALT: 14 U/L (ref 6–29)
AST: 21 U/L (ref 10–35)
BILIRUBIN TOTAL: 0.3 mg/dL (ref 0.2–1.2)
BUN: 19 mg/dL (ref 7–25)
CALCIUM: 9.3 mg/dL (ref 8.6–10.4)
CO2: 26 mmol/L (ref 20–31)
CREATININE: 0.9 mg/dL (ref 0.50–1.05)
Chloride: 102 mmol/L (ref 98–110)
GLUCOSE: 84 mg/dL (ref 65–99)
Potassium: 3.8 mmol/L (ref 3.5–5.3)
Sodium: 136 mmol/L (ref 135–146)
Total Protein: 6.7 g/dL (ref 6.1–8.1)

## 2015-05-03 LAB — LIPID PANEL
Cholesterol: 160 mg/dL (ref 125–200)
HDL: 52 mg/dL (ref >=46)
LDL Cholesterol: 96 mg/dL (ref <130)
TRIGLYCERIDES: 60 mg/dL (ref <150)
Total CHOL/HDL Ratio: 3.1 Ratio (ref <=5.0)
VLDL: 12 mg/dL (ref <30)

## 2015-05-03 LAB — CBC WITH DIFFERENTIAL/PLATELET
BASOS ABS: 0 10*3/uL (ref 0.0–0.1)
Basophils Relative: 0 % (ref 0–1)
Eosinophils Absolute: 0.2 10*3/uL (ref 0.0–0.7)
Eosinophils Relative: 3 % (ref 0–5)
HEMATOCRIT: 39.5 % (ref 36.0–46.0)
HEMOGLOBIN: 13.9 g/dL (ref 12.0–15.0)
LYMPHS PCT: 26 % (ref 12–46)
Lymphs Abs: 1.4 10*3/uL (ref 0.7–4.0)
MCH: 32.3 pg (ref 26.0–34.0)
MCHC: 35.2 g/dL (ref 30.0–36.0)
MCV: 91.6 fL (ref 78.0–100.0)
MPV: 10.2 fL (ref 8.6–12.4)
Monocytes Absolute: 0.4 10*3/uL (ref 0.1–1.0)
Monocytes Relative: 7 % (ref 3–12)
NEUTROS ABS: 3.5 10*3/uL (ref 1.7–7.7)
NEUTROS PCT: 64 % (ref 43–77)
Platelets: 196 10*3/uL (ref 150–400)
RBC: 4.31 MIL/uL (ref 3.87–5.11)
RDW: 12.5 % (ref 11.5–15.5)
WBC: 5.5 10*3/uL (ref 4.0–10.5)

## 2015-05-03 MED ORDER — FLUOXETINE HCL 10 MG PO CAPS: 10.0000 mg | ORAL_CAPSULE | Freq: Every day | ORAL | Status: DC

## 2015-05-03 NOTE — Addendum Note (Signed)
Addended by: Nelva Nay on: 05/03/2015 03:58 PM   Modules accepted: Orders

## 2015-05-03 NOTE — Patient Instructions (Signed)
Start on the Prozac as we discussed. Call me if you have any issues with this. Call me right away if you start to feel more depressed.  You may obtain a copy of any labs that were done today by logging onto MyChart as outlined in the instructions provided with your AVS (after visit summary). The office will not call with normal lab results but certainly if there are any significant abnormalities then we will contact you.   Health Maintenance Adopting a healthy lifestyle and getting preventive care can go a long way to promote health and wellness. Talk with your health care provider about what schedule of regular examinations is right for you. This is a good chance for you to check in with your provider about disease prevention and staying healthy. In between checkups, there are plenty of things you can do on your own. Experts have done a lot of research about which lifestyle changes and preventive measures are most likely to keep you healthy. Ask your health care provider for more information. WEIGHT AND DIET  Eat a healthy diet  Be sure to include plenty of vegetables, fruits, low-fat dairy products, and lean protein.  Do not eat a lot of foods high in solid fats, added sugars, or salt.  Get regular exercise. This is one of the most important things you can do for your health.  Most adults should exercise for at least 150 minutes each week. The exercise should increase your heart rate and make you sweat (moderate-intensity exercise).  Most adults should also do strengthening exercises at least twice a week. This is in addition to the moderate-intensity exercise.  Maintain a healthy weight  Body mass index (BMI) is a measurement that can be used to identify possible weight problems. It estimates body fat based on height and weight. Your health care provider can help determine your BMI and help you achieve or maintain a healthy weight.  For females 69 years of age and older:   A BMI below  18.5 is considered underweight.  A BMI of 18.5 to 24.9 is normal.  A BMI of 25 to 29.9 is considered overweight.  A BMI of 30 and above is considered obese.  Watch levels of cholesterol and blood lipids  You should start having your blood tested for lipids and cholesterol at 54 years of age, then have this test every 5 years.  You may need to have your cholesterol levels checked more often if:  Your lipid or cholesterol levels are high.  You are older than 54 years of age.  You are at high risk for heart disease.  CANCER SCREENING   Lung Cancer  Lung cancer screening is recommended for adults 61-54 years old who are at high risk for lung cancer because of a history of smoking.  A yearly low-dose CT scan of the lungs is recommended for people who:  Currently smoke.  Have quit within the past 15 years.  Have at least a 30-pack-year history of smoking. A pack year is smoking an average of one pack of cigarettes a day for 1 year.  Yearly screening should continue until it has been 15 years since you quit.  Yearly screening should stop if you develop a health problem that would prevent you from having lung cancer treatment.  Breast Cancer  Practice breast self-awareness. This means understanding how your breasts normally appear and feel.  It also means doing regular breast self-exams. Let your health care provider know about any changes,  no matter how small.  If you are in your 20s or 30s, you should have a clinical breast exam (CBE) by a health care provider every 1-3 years as part of a regular health exam.  If you are 87 or older, have a CBE every year. Also consider having a breast X-ray (mammogram) every year.  If you have a family history of breast cancer, talk to your health care provider about genetic screening.  If you are at high risk for breast cancer, talk to your health care provider about having an MRI and a mammogram every year.  Breast cancer gene (BRCA)  assessment is recommended for women who have family members with BRCA-related cancers. BRCA-related cancers include:  Breast.  Ovarian.  Tubal.  Peritoneal cancers.  Results of the assessment will determine the need for genetic counseling and BRCA1 and BRCA2 testing. Cervical Cancer Routine pelvic examinations to screen for cervical cancer are no longer recommended for nonpregnant women who are considered low risk for cancer of the pelvic organs (ovaries, uterus, and vagina) and who do not have symptoms. A pelvic examination may be necessary if you have symptoms including those associated with pelvic infections. Ask your health care provider if a screening pelvic exam is right for you.   The Pap test is the screening test for cervical cancer for women who are considered at risk.  If you had a hysterectomy for a problem that was not cancer or a condition that could lead to cancer, then you no longer need Pap tests.  If you are older than 65 years, and you have had normal Pap tests for the past 10 years, you no longer need to have Pap tests.  If you have had past treatment for cervical cancer or a condition that could lead to cancer, you need Pap tests and screening for cancer for at least 20 years after your treatment.  If you no longer get a Pap test, assess your risk factors if they change (such as having a new sexual partner). This can affect whether you should start being screened again.  Some women have medical problems that increase their chance of getting cervical cancer. If this is the case for you, your health care provider may recommend more frequent screening and Pap tests.  The human papillomavirus (HPV) test is another test that may be used for cervical cancer screening. The HPV test looks for the virus that can cause cell changes in the cervix. The cells collected during the Pap test can be tested for HPV.  The HPV test can be used to screen women 64 years of age and older.  Getting tested for HPV can extend the interval between normal Pap tests from three to five years.  An HPV test also should be used to screen women of any age who have unclear Pap test results.  After 54 years of age, women should have HPV testing as often as Pap tests.  Colorectal Cancer  This type of cancer can be detected and often prevented.  Routine colorectal cancer screening usually begins at 54 years of age and continues through 54 years of age.  Your health care provider may recommend screening at an earlier age if you have risk factors for colon cancer.  Your health care provider may also recommend using home test kits to check for hidden blood in the stool.  A small camera at the end of a tube can be used to examine your colon directly (sigmoidoscopy or colonoscopy). This  is done to check for the earliest forms of colorectal cancer.  Routine screening usually begins at age 9.  Direct examination of the colon should be repeated every 5-10 years through 54 years of age. However, you may need to be screened more often if early forms of precancerous polyps or small growths are found. Skin Cancer  Check your skin from head to toe regularly.  Tell your health care provider about any new moles or changes in moles, especially if there is a change in a mole's shape or color.  Also tell your health care provider if you have a mole that is larger than the size of a pencil eraser.  Always use sunscreen. Apply sunscreen liberally and repeatedly throughout the day.  Protect yourself by wearing long sleeves, pants, a wide-brimmed hat, and sunglasses whenever you are outside. HEART DISEASE, DIABETES, AND HIGH BLOOD PRESSURE   Have your blood pressure checked at least every 1-2 years. High blood pressure causes heart disease and increases the risk of stroke.  If you are between 19 years and 34 years old, ask your health care provider if you should take aspirin to prevent  strokes.  Have regular diabetes screenings. This involves taking a blood sample to check your fasting blood sugar level.  If you are at a normal weight and have a low risk for diabetes, have this test once every three years after 54 years of age.  If you are overweight and have a high risk for diabetes, consider being tested at a younger age or more often. PREVENTING INFECTION  Hepatitis B  If you have a higher risk for hepatitis B, you should be screened for this virus. You are considered at high risk for hepatitis B if:  You were born in a country where hepatitis B is common. Ask your health care provider which countries are considered high risk.  Your parents were born in a high-risk country, and you have not been immunized against hepatitis B (hepatitis B vaccine).  You have HIV or AIDS.  You use needles to inject street drugs.  You live with someone who has hepatitis B.  You have had sex with someone who has hepatitis B.  You get hemodialysis treatment.  You take certain medicines for conditions, including cancer, organ transplantation, and autoimmune conditions. Hepatitis C  Blood testing is recommended for:  Everyone born from [birth year] through 1964.  Anyone with known risk factors for hepatitis C. Sexually transmitted infections (STIs)  You should be screened for sexually transmitted infections (STIs) including gonorrhea and chlamydia if:  You are sexually active and are younger than 54 years of age.  You are older than 54 years of age and your health care provider tells you that you are at risk for this type of infection.  Your sexual activity has changed since you were last screened and you are at an increased risk for chlamydia or gonorrhea. Ask your health care provider if you are at risk.  If you do not have HIV, but are at risk, it may be recommended that you take a prescription medicine daily to prevent HIV infection. This is called pre-exposure prophylaxis  (PrEP). You are considered at risk if:  You are sexually active and do not regularly use condoms or know the HIV status of your partner(s).  You take drugs by injection.  You are sexually active with a partner who has HIV. Talk with your health care provider about whether you are at high risk of being  infected with HIV. If you choose to begin PrEP, you should first be tested for HIV. You should then be tested every 3 months for as long as you are taking PrEP.  PREGNANCY   If you are premenopausal and you may become pregnant, ask your health care provider about preconception counseling.  If you may become pregnant, take 400 to 800 micrograms (mcg) of folic acid every day.  If you want to prevent pregnancy, talk to your health care provider about birth control (contraception). OSTEOPOROSIS AND MENOPAUSE   Osteoporosis is a disease in which the bones lose minerals and strength with aging. This can result in serious bone fractures. Your risk for osteoporosis can be identified using a bone density scan.  If you are 53 years of age or older, or if you are at risk for osteoporosis and fractures, ask your health care provider if you should be screened.  Ask your health care provider whether you should take a calcium or vitamin D supplement to lower your risk for osteoporosis.  Menopause may have certain physical symptoms and risks.  Hormone replacement therapy may reduce some of these symptoms and risks. Talk to your health care provider about whether hormone replacement therapy is right for you.  HOME CARE INSTRUCTIONS   Schedule regular health, dental, and eye exams.  Stay current with your immunizations.   Do not use any tobacco products including cigarettes, chewing tobacco, or electronic cigarettes.  If you are pregnant, do not drink alcohol.  If you are breastfeeding, limit how much and how often you drink alcohol.  Limit alcohol intake to no more than 1 drink per day for  nonpregnant women. One drink equals 12 ounces of beer, 5 ounces of wine, or 1 ounces of hard liquor.  Do not use street drugs.  Do not share needles.  Ask your health care provider for help if you need support or information about quitting drugs.  Tell your health care provider if you often feel depressed.  Tell your health care provider if you have ever been abused or do not feel safe at home. Document Released: 10/20/2010 Document Revised: 08/21/2013 Document Reviewed: 03/08/2013 [facility] Patient Information 2015 [place], [place]. This information is not intended to replace advice given to you by your health care provider. Make sure you discuss any questions you have with your health care provider.

## 2015-05-03 NOTE — Progress Notes (Signed)
Melanie Valenzuela [birth year] [id number]        55 y.o.  G2P2002  for annual exam.  Several issues noted below.  Past medical history,surgical history, problem list, medications, allergies, family history and social history were all reviewed and documented as reviewed in the EPIC chart.  ROS:  Performed with pertinent positives and negatives included in the history, assessment and plan.   Additional significant findings :  none   Exam: Melanie Valenzuela assistant Filed Vitals:   05/03/15 1519  BP: 120/76  Height: 5' 1.5" (1.562 m)  Weight: 126 lb (57.153 kg)   General appearance:  Normal affect, orientation and appearance. Skin: Grossly normal HEENT: Without gross lesions.  No cervical or supraclavicular adenopathy. Thyroid normal.  Lungs:  Clear without wheezing, rales or rhonchi Cardiac: RR, without RMG Abdominal:  Soft, nontender, without masses, guarding, rebound, organomegaly or hernia Breasts:  Examined lying and sitting without masses, retractions, discharge or axillary adenopathy. Pelvic:  Ext/BUS/vagina normal  Cervix normal. Pap/HPV. IUD strings visualized  Uterus anteverted, normal size, shape and contour, midline and mobile nontender   Adnexa  Without masses or tenderness    Anus and perineum  Normal   Rectovaginal  Normal sphincter tone without palpated masses or tenderness.    Assessment/Plan:  55 y.o. G66P2002 female for annual exam with irregular menses, Mirena IUD.   1. Irregular menses. Patient's menses have become more irregular where she'll skip several months but when she has a menses is regular when it occurs. No prolonged or atypical bleeding. [place] was 31 last year. Not having significant hot flashes or night sweats. We'll monitor for now and call if prolonged or atypical bleeding. As long as less frequent but regular menses then follow. 2. Mirena IUD 06/2013. Doing well with this and will continue. IUD strings visualized. 3. Depression. Patient notes having some  issues with depression and foggy thinking. No weight loss hair or skin changes. Check TSH. Options for management include low-dose estrogen trial using Mirena as he progesterone endometrial protection. Discussed operative and labeling with this. Patient is very fearful of estrogen but wanted to try an anxiolytic. She had been on Zoloft years ago. Discussed various options and ultimately we decided a trial of fluoxetine 10 mg as she wanted the lowest dose possible. Will call me in a month or so for dose adjustment as to how she's doing. Side effects and risks to include worsening depression/suicide ideation reviewed. Need to call ASAP if she has these feelings. #30 with 12 refills provided. 4. Pap smear/HPV negative 2016.  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 ASCUS negative high risk HPV 2012. Negative Pap smears since then. Patient very nervous about less frequent screening intervals request yearly Pap smear with HPV. Pap smear with HPV done today. 5. Mammography 02/2015. Continue with annual mammography when due. SBE monthly review. 6. Colonoscopy  2014. Repeat at their recommended interval. 7. Health maintenance. Baseline CBC, comprehensive metabolic panel, lipid profile, urinalysis, TSH and vitamin D ordered. Follow up with fluoxetine results otherwise annual exam in one year.   Melanie Auerbach MD, 3:45 PM 05/03/2015

## 2015-05-04 LAB — URINALYSIS W MICROSCOPIC + REFLEX CULTURE
BACTERIA UA: NONE SEEN [HPF]
BILIRUBIN URINE: NEGATIVE
Casts: NONE SEEN [LPF]
Crystals: NONE SEEN [HPF]
GLUCOSE, UA: NEGATIVE
Hgb urine dipstick: NEGATIVE
Ketones, ur: NEGATIVE
LEUKOCYTES UA: NEGATIVE
Nitrite: NEGATIVE
Protein, ur: NEGATIVE
RBC / HPF: NONE SEEN RBC/HPF (ref <=2)
SPECIFIC GRAVITY, URINE: 1.025 (ref 1.001–1.035)
Squamous Epithelial / LPF: NONE SEEN [HPF] (ref <=5)
WBC UA: NONE SEEN WBC/HPF (ref <=5)
Yeast: NONE SEEN [HPF]
pH: 5 (ref 5.0–8.0)

## 2015-05-04 LAB — TSH: TSH: 3.664 u[IU]/mL (ref 0.350–4.500)

## 2015-05-04 LAB — VITAMIN D 25 HYDROXY (VIT D DEFICIENCY, FRACTURES): Vit D, 25-Hydroxy: 46 ng/mL (ref 30–100)

## 2015-05-07 LAB — CYTOLOGY - PAP

## 2015-05-14 ENCOUNTER — Telehealth: Payer: Self-pay

## 2015-05-14 ENCOUNTER — Telehealth: Payer: Self-pay | Admitting: *Deleted

## 2015-05-14 MED ORDER — FLUOXETINE HCL 20 MG PO CAPS
20.0000 mg | ORAL_CAPSULE | Freq: Every day | ORAL | Status: DC
Start: 2015-05-14 — End: 2016-05-25

## 2015-05-14 NOTE — Telephone Encounter (Signed)
Okay to increase to fluoxetine 20 mg daily with refills through next annual exam

## 2015-05-14 NOTE — Telephone Encounter (Signed)
Left on voicemail Rx sent 

## 2015-05-14 NOTE — Telephone Encounter (Signed)
Called for pap result. Informed pap normal cells and neg for HPV.

## 2015-05-14 NOTE — Telephone Encounter (Signed)
Pt take Prozac 10 mg, was told to call for increase if she felt no better, pt would like to be increased to 20 mg. Was seen on 05/03/15. Please advise

## 2015-07-03 ENCOUNTER — Other Ambulatory Visit: Payer: Self-pay | Admitting: Family Medicine

## 2015-07-03 DIAGNOSIS — N644 Mastodynia: Secondary | ICD-10-CM

## 2015-07-03 DIAGNOSIS — N6452 Nipple discharge: Secondary | ICD-10-CM

## 2015-07-11 ENCOUNTER — Ambulatory Visit
Admission: RE | Admit: 2015-07-11 | Discharge: 2015-07-11 | Disposition: A | Discharge status: Home / Self Care | Payer: BLUE CROSS/BLUE SHIELD | Source: Ambulatory Visit | Attending: Family Medicine | Admitting: Family Medicine

## 2015-07-11 DIAGNOSIS — N6452 Nipple discharge: Secondary | ICD-10-CM

## 2015-07-11 DIAGNOSIS — N644 Mastodynia: Secondary | ICD-10-CM

## 2015-07-22 ENCOUNTER — Telehealth: Payer: Self-pay | Admitting: *Deleted

## 2015-07-22 DIAGNOSIS — N926 Irregular menstruation, unspecified: Secondary | ICD-10-CM

## 2015-07-22 NOTE — Telephone Encounter (Signed)
Okay to schedule ultrasound 

## 2015-07-22 NOTE — Telephone Encounter (Signed)
Pt called to follow up from annual 05/03/15 regarding bleeding, states for the whole month of march she had spotting everyday, has Mirena IUD as well. Pt thought you told her at annual if continues ultrasound will be ordered, pt would like to proceed with this for a piece of mind. Please advise

## 2015-07-22 NOTE — Telephone Encounter (Signed)
Pt called and left message in traige voicemail to follow up regarding irregular menses, I left message for pt to call me.

## 2015-07-22 NOTE — Telephone Encounter (Signed)
Left on voicemail to call appontments to schedule.  Order placed.

## 2015-07-26 ENCOUNTER — Other Ambulatory Visit: Payer: Self-pay | Admitting: Gynecology

## 2015-07-26 ENCOUNTER — Encounter: Payer: Self-pay | Admitting: Gynecology

## 2015-07-26 ENCOUNTER — Ambulatory Visit (INDEPENDENT_AMBULATORY_CARE_PROVIDER_SITE_OTHER): Payer: BLUE CROSS/BLUE SHIELD | Admitting: Gynecology

## 2015-07-26 VITALS — BP 112/66

## 2015-07-26 DIAGNOSIS — N926 Irregular menstruation, unspecified: Secondary | ICD-10-CM

## 2015-07-26 DIAGNOSIS — N8189 Other female genital prolapse: Secondary | ICD-10-CM | POA: Diagnosis not present

## 2015-07-26 DIAGNOSIS — N3946 Mixed incontinence: Secondary | ICD-10-CM | POA: Diagnosis not present

## 2015-07-26 LAB — URINALYSIS W MICROSCOPIC + REFLEX CULTURE
Bilirubin Urine: NEGATIVE
Casts: NONE SEEN [LPF]
Crystals: NONE SEEN [HPF]
Glucose, UA: NEGATIVE
KETONES UR: NEGATIVE
LEUKOCYTES UA: NEGATIVE
NITRITE: NEGATIVE
PH: 5 (ref 5.0–8.0)
Protein, ur: NEGATIVE
RBC / HPF: NONE SEEN RBC/HPF (ref <=2)
YEAST: NONE SEEN [HPF]

## 2015-07-26 NOTE — Progress Notes (Signed)
Melanie Valenzuela Mar 25, 1961 [id number]        55 y.o.  G2P2002 presents with several issues:  1. Has Mirena IUD placed 06/2013. Was having regular monthly menses. Of the past year they became more irregular with skips and had an [facility] last year 47. Had no menses for 3-4 months this fall and then resumed regular menses 3 cycles until March when she has bled on and off since then. Some days like menses other days just light spotting to no bleeding. No significant pain or cramping. 2. Has had an issue with stress incontinence with laughing coughing sneezing but not to the point that she wanted to pursue any further workup. Over the past several weeks she's had several episodes where she will lose urine while walking spontaneously without stress situation. Not having frequency dysuria or urgency. Felt a little bit of pressure and she wanted to make sure that she did not have any significant prolapse or other issues. 3. Her boyfriend who has been out of town since January called her and said he has been seen and treated for a genital wart. Patient does have a history of HPV in the past although has had negative screens recently. He denies any contact with anybody else with no history of prior warts. Patient had questions about this.  Past medical history,surgical history, problem list, medications, allergies, family history and social history were all reviewed and documented in the EPIC chart.  Directed ROS with pertinent positives and negatives documented in the history of present illness/assessment and plan.  Exam: [name] assistant Filed Vitals:   07/26/15 1523  BP: 112/66   General appearance:  Normal Spine straight without CVA tenderness Abdomen soft nontender without masses guarding rebound Pelvic external BUS vagina with slight cystocele and mild rectocele. Also some mobility in her uterus but no significant prolapse. Cervix grossly normal. IUD string not clearly visualized. Uterus  normal size midline mobile nontender. Adnexa without masses or tenderness. Rectovaginal exam confirms slight rectocele.  Assessment/Plan:  55 y.o. [id number] with:  1. Irregular bleeding with IUD. I think a lot of this bleeding is perimenopausal given her higher FSH and skips in her menses. She has had some persistent on and off bleeding of the last month. Had a regular ultrasound scheduled and I asked her to convert this to a sonohysterogram to better define the cavity allow for sampling if needed. Reviewed several possibilities with her to include perimenopausal hormonal irregularity, structural abnormality such as polyps or other endometrial defects. We'll further discuss following the sonohysterogram. 2. Urinary incontinence. Had classic straightforward stress incontinence previously but now has had several episodes of loss of urine with no provocation. Urinalysis shows few bacteria but really unremarkable. We'll culture for completeness. Recommend urologic evaluation. Patient wants to wait the sonohysterogram and then we'll discuss referral to urology. Will follow up on culture. 3. Pelvic relaxation. I reviewed with her her mild cystocele rectocele and uterine mobility. She is really asymptomatic from a pressure discomfort standpoint. The issues of whether to monitor versus intervening such as surgery discussed. If she would see urology and they wanted to do a bladder suspension or cystocele repair with suspension the issue as to whether to do a concomitant hysterectomy/posterior repair also discussed. At this point we decided to hold on further discussion until after her urologic evaluation since she is really symptomatically from a prolapse standpoint. 4. Boyfriend with diagnoses genital wart. I reviewed with her the various possibilities to include he was exposed  to HPV years ago and now developed symptoms. Possibly she exposed him to HPV and now he has developed symptoms or that he most recently was  exposed to HPV while out of town. She has no question as far as his fidelity and therefore if indeed this was a genital wart he either was exposed years ago from prior relationships or she exposed them during their relationship. Regardless I reviewed with her the really is nothing further can be done as far as protecting her from exposure other than abstinence. Again discussed though if there is any question of infidelity then she may be at risk of being exposed other STDs like GC and Chlamydia but she does not feel this is the issue.    [name] MD, 4:01 PM 07/26/2015

## 2015-07-26 NOTE — Patient Instructions (Signed)
Follow up for sonohysterogram as scheduled. 

## 2015-07-26 NOTE — Addendum Note (Signed)
Addended by: Nelva Nay on: 07/26/2015 04:18 PM   Modules accepted: Orders

## 2015-07-27 LAB — URINE CULTURE
Colony Count: NO GROWTH
ORGANISM ID, BACTERIA: NO GROWTH

## 2015-07-29 ENCOUNTER — Other Ambulatory Visit: Payer: Self-pay | Admitting: Gynecology

## 2015-07-29 DIAGNOSIS — N939 Abnormal uterine and vaginal bleeding, unspecified: Secondary | ICD-10-CM

## 2015-07-30 ENCOUNTER — Telehealth: Payer: Self-pay | Admitting: Gynecology

## 2015-07-30 NOTE — Telephone Encounter (Signed)
07/30/15-I LM VM for pt that her [facility] states she has a 20% coins on sonoysterogram and bx if done. She has met her $750 deductible but has an out of pocket of $5000. Allowable is $875.19 so her cost would be $175.04. Bx is $20.29 additional.wl [email or web address] [id number]

## 2015-07-31 ENCOUNTER — Encounter: Payer: Self-pay | Admitting: Gynecology

## 2015-07-31 ENCOUNTER — Ambulatory Visit (INDEPENDENT_AMBULATORY_CARE_PROVIDER_SITE_OTHER): Payer: BLUE CROSS/BLUE SHIELD | Admitting: Gynecology

## 2015-07-31 ENCOUNTER — Ambulatory Visit (INDEPENDENT_AMBULATORY_CARE_PROVIDER_SITE_OTHER): Payer: BLUE CROSS/BLUE SHIELD

## 2015-07-31 VITALS — BP 112/70

## 2015-07-31 DIAGNOSIS — N939 Abnormal uterine and vaginal bleeding, unspecified: Secondary | ICD-10-CM

## 2015-07-31 DIAGNOSIS — N926 Irregular menstruation, unspecified: Secondary | ICD-10-CM

## 2015-07-31 NOTE — Patient Instructions (Signed)
Office with call you with biopsy results. Office will call you to arrange the urology appointment. If you do not hear from their office within a week or so call our office.

## 2015-07-31 NOTE — Progress Notes (Signed)
    Blessen Carvajal Zahn 17-Oct-1960 [id number]        55 y.o.  [id number] Presents for sonohysterogram due to persistent irregular bleeding with IUD.  Menses became more irregular this past year with skips noting [place] 56 last year. Went 3-4 months with no menses this fall and then resumed regular menses monthly until March when she started bleeding on and off.  Past medical history,surgical history, problem list, medications, allergies, family history and social history were all reviewed and documented in the EPIC chart.  Directed ROS with pertinent positives and negatives documented in the history of present illness/assessment and plan.  Exam: [place] assistant Filed Vitals:   07/31/15 1640  BP: 112/70   General appearance:  Normal  abdomen soft nontender without masses guarding rebound Pelvic external BUS vagina normal. Cervix normal. Uterus anteverted normal size midline mobile nontender. Adnexa without masses or tenderness.  Ultrasound shows uterus normal size and echotexture. Endometrial echo 1.7 mm. IUD visualized in normal position. Right ovary small consistent with history of prior surgery. Left ovary normal. Cul-de-sac negative.  Sonohysterogram performed, sterile technique, easy catheter introduction, good distention with no abnormalities. Endometrial sample taken. Ultrasound following showed IUD undisturbed.  Assessment/Plan:  55 y.o. [id number] with history of irregular bleeding and Mirena IUD. Patient will follow up for biopsy results. Will keep menstrual calendar over the next several months. If significant irregular bleeding she'll represent for further evaluation. If fairly regular bleeding then will monitor. Patient did ask for a urology referral in reference to her history of stress incontinence and will go ahead and ranges for her.    [name] MD, 4:52 PM 07/31/2015

## 2015-08-01 ENCOUNTER — Telehealth: Payer: Self-pay | Admitting: *Deleted

## 2015-08-01 NOTE — Telephone Encounter (Signed)
Referral faxed they will fax me back with time and date to relay to patient.  

## 2015-08-01 NOTE — Telephone Encounter (Signed)
-----   Message from [name], MD sent at 07/31/2015  4:51 PM EDT ----- Arrange urology appointment reference stress urinary incontinence

## 2015-08-08 ENCOUNTER — Other Ambulatory Visit: Payer: BLUE CROSS/BLUE SHIELD

## 2015-08-08 ENCOUNTER — Ambulatory Visit: Payer: BLUE CROSS/BLUE SHIELD | Admitting: Gynecology

## 2015-08-08 NOTE — Telephone Encounter (Signed)
Appointment 10/01/2015 @ 3:15pm with Dr.Tannebaum

## 2015-08-08 NOTE — Telephone Encounter (Signed)
I called [facility] to see if patient has been contacted they have tried to call her twice to schedule.I called pt and left on her voicemail to call [phone] to schedule appointment.

## 2015-11-14 ENCOUNTER — Other Ambulatory Visit: Payer: Self-pay | Admitting: Physician Assistant

## 2015-12-02 ENCOUNTER — Telehealth: Payer: Self-pay | Admitting: *Deleted

## 2015-12-02 NOTE — Telephone Encounter (Signed)
Pt called and left message in triage voicemail has Mirena IUD unable to feel strings from IUD, had unprotected sex on Friday. Pt asked me to leave a detailed message on voicemail as she is at work, I advised her to schedule office visit with provider to check strings and if pregnancy is concern plan B is option that is OTC to call me if further questions.

## 2015-12-05 ENCOUNTER — Ambulatory Visit (INDEPENDENT_AMBULATORY_CARE_PROVIDER_SITE_OTHER): Payer: No Typology Code available for payment source | Admitting: Gynecology

## 2015-12-05 ENCOUNTER — Encounter: Payer: Self-pay | Admitting: Gynecology

## 2015-12-05 VITALS — BP 120/76

## 2015-12-05 DIAGNOSIS — Z30431 Encounter for routine checking of intrauterine contraceptive device: Secondary | ICD-10-CM

## 2015-12-05 DIAGNOSIS — Z8669 Personal history of other diseases of the nervous system and sense organs: Secondary | ICD-10-CM

## 2015-12-05 MED ORDER — SUMATRIPTAN SUCCINATE 100 MG PO TABS: 100.0000 mg | ORAL_TABLET | ORAL | 0 refills | Status: DC | PRN

## 2015-12-05 NOTE — Progress Notes (Signed)
    Melanie Valenzuela February 20, 1961 [id number]        55 y.o.  G2P2002 presents unable to feel her IUD string. History of irregular bleeding ultimately underwent sonohysterogram with endometrial biopsy which was benign in April. Has had no bleeding since then.  Past medical history,surgical history, problem list, medications, allergies, family history and social history were all reviewed and documented in the EPIC chart.  Directed ROS with pertinent positives and negatives documented in the history of present illness/assessment and plan.  Exam: [name] assistant Vitals:   12/05/15 0757  BP: 120/76   General appearance:  Normal Abdomen soft nontender without masses guarding rebound Pelvic external BUS vagina normal. Cervix normal. IUD string not visualized. Uterus normal size midline mobile nontender. Adnexa without masses or tenderness.  Colposcopy performed with endocervical speculum IUD string was visualized just within the external os.  Assessment/Plan:  55 y.o. [id number] with IUD string visualized. Patient reassured. Does have occasional migraine headaches. Requested refill on her Imitrex. #10 provided. Follow up January 2018 when due for annual exam, sooner if any issues.    [name] MD, 8:13 AM 12/05/2015

## 2015-12-05 NOTE — Patient Instructions (Signed)
Follow up January 2018 when due for annual exam

## 2016-02-11 ENCOUNTER — Other Ambulatory Visit: Payer: Self-pay | Admitting: Gynecology

## 2016-02-11 DIAGNOSIS — Z1231 Encounter for screening mammogram for malignant neoplasm of breast: Secondary | ICD-10-CM

## 2016-03-17 ENCOUNTER — Ambulatory Visit
Admission: RE | Admit: 2016-03-17 | Discharge: 2016-03-17 | Disposition: A | Discharge status: Home / Self Care | Payer: No Typology Code available for payment source | Source: Ambulatory Visit | Attending: Gynecology | Admitting: Gynecology

## 2016-03-17 DIAGNOSIS — Z1231 Encounter for screening mammogram for malignant neoplasm of breast: Secondary | ICD-10-CM

## 2016-05-08 ENCOUNTER — Encounter: Payer: BLUE CROSS/BLUE SHIELD | Admitting: Gynecology

## 2016-05-19 ENCOUNTER — Encounter: Payer: Self-pay | Admitting: Gynecology

## 2016-05-19 ENCOUNTER — Ambulatory Visit (INDEPENDENT_AMBULATORY_CARE_PROVIDER_SITE_OTHER): Payer: No Typology Code available for payment source | Admitting: Gynecology

## 2016-05-19 VITALS — BP 116/76 | Ht 62.0 in | Wt 143.0 lb

## 2016-05-19 DIAGNOSIS — Z30431 Encounter for routine checking of intrauterine contraceptive device: Secondary | ICD-10-CM

## 2016-05-19 DIAGNOSIS — Z13220 Encounter for screening for lipoid disorders: Secondary | ICD-10-CM

## 2016-05-19 DIAGNOSIS — N951 Menopausal and female climacteric states: Secondary | ICD-10-CM | POA: Diagnosis not present

## 2016-05-19 DIAGNOSIS — Z01419 Encounter for gynecological examination (general) (routine) without abnormal findings: Secondary | ICD-10-CM | POA: Diagnosis not present

## 2016-05-19 LAB — COMPREHENSIVE METABOLIC PANEL
ALK PHOS: 69 U/L (ref 33–130)
ALT: 16 U/L (ref 6–29)
AST: 25 U/L (ref 10–35)
Albumin: 4.3 g/dL (ref 3.6–5.1)
BILIRUBIN TOTAL: 0.3 mg/dL (ref 0.2–1.2)
BUN: 34 mg/dL — AB (ref 7–25)
CALCIUM: 9.2 mg/dL (ref 8.6–10.4)
CO2: 25 mmol/L (ref 20–31)
Chloride: 103 mmol/L (ref 98–110)
Creat: 0.97 mg/dL (ref 0.50–1.05)
GLUCOSE: 80 mg/dL (ref 65–99)
Potassium: 4 mmol/L (ref 3.5–5.3)
SODIUM: 138 mmol/L (ref 135–146)
Total Protein: 7 g/dL (ref 6.1–8.1)

## 2016-05-19 LAB — CBC WITH DIFFERENTIAL/PLATELET
Basophils Absolute: 0 cells/uL (ref 0–200)
Basophils Relative: 0 %
EOS PCT: 4 %
Eosinophils Absolute: 220 cells/uL (ref 15–500)
HCT: 38.8 % (ref 35.0–45.0)
Hemoglobin: 13.1 g/dL (ref 11.7–15.5)
LYMPHS PCT: 25 %
Lymphs Abs: 1375 cells/uL (ref 850–3900)
MCH: 31.4 pg (ref 27.0–33.0)
MCHC: 33.8 g/dL (ref 32.0–36.0)
MCV: 93 fL (ref 80.0–100.0)
MONO ABS: 495 {cells}/uL (ref 200–950)
MONOS PCT: 9 %
MPV: 10.4 fL (ref 7.5–12.5)
Neutro Abs: 3410 cells/uL (ref 1500–7800)
Neutrophils Relative %: 62 %
PLATELETS: 207 10*3/uL (ref 140–400)
RBC: 4.17 MIL/uL (ref 3.80–5.10)
RDW: 12.8 % (ref 11.0–15.0)
WBC: 5.5 10*3/uL (ref 3.8–10.8)

## 2016-05-19 LAB — LIPID PANEL
Cholesterol: 150 mg/dL (ref <200)
HDL: 47 mg/dL — AB (ref >50)
LDL CALC: 89 mg/dL (ref <100)
Total CHOL/HDL Ratio: 3.2 Ratio (ref <5.0)
Triglycerides: 69 mg/dL (ref <150)
VLDL: 14 mg/dL (ref <30)

## 2016-05-19 LAB — TSH: TSH: 2.56 mIU/L

## 2016-05-19 NOTE — Progress Notes (Signed)
    Melanie Valenzuela July 31, 1960 [id number]        56 y.o.  [id number] for annual exam.    Past medical history,surgical history, problem list, medications, allergies, family history and social history were all reviewed and documented as reviewed in the EPIC chart.  ROS:  Performed with pertinent positives and negatives included in the history, assessment and plan.   Additional significant findings :  None   Exam: Melanie Valenzuela assistant Vitals:   05/19/16 1539  BP: 116/76  Weight: 143 lb (64.9 kg)  Height: 5\' 2"  (1.575 m)   Body mass index is 26.16 kg/m.  General appearance:  Normal affect, orientation and appearance. Skin: Grossly normal HEENT: Without gross lesions.  No cervical or supraclavicular adenopathy. Thyroid normal.  Lungs:  Clear without wheezing, rales or rhonchi Cardiac: RR, without RMG Abdominal:  Soft, nontender, without masses, guarding, rebound, organomegaly or hernia Breasts:  Examined lying and sitting without masses, retractions, discharge or axillary adenopathy. Pelvic:  Ext, BUS, Vagina normal  Cervix normal. Pap smear done. IUD string visualized at external os  Uterus anteverted, normal size, shape and contour, midline and mobile nontender   Adnexa without masses or tenderness    Anus and perineum normal   Rectovaginal normal sphincter tone without palpated masses or tenderness.    Assessment/Plan:  56 y.o. [id number] female for annual exam.   1. Mirena IUD 06/2013. Doing well with no menses. IUD string visualized. 2. Menopausal symptoms. Having some hot flushes. Will check baseline TSH FSH. Is not interested in considering HRT at this time. 3. Pap smear 2017. Pap smear done today. 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 Numerous normal Pap smears since then. Patient very uncomfortable with less frequent screening intervals. Prefers yearly Pap smears. 4. Mammography 02/2016. Continue with annual mammography when due. SBE monthly  reviewed. 5. Colonoscopy 2014. Repeat at their recommended interval. 6. DEXA never. Will plan further into the menopause. Vitamin D level normal last year. 7. Health maintenance. Baseline CBC, CMP, lipid profile, urinalysis, TSH, FSH ordered. Follow up in one year, sooner as needed.   [name] MD, 3:59 PM 05/19/2016

## 2016-05-19 NOTE — Addendum Note (Signed)
Addended by: Nelva Nay on: 05/19/2016 04:15 PM   Modules accepted: Orders

## 2016-05-19 NOTE — Patient Instructions (Signed)

## 2016-05-20 ENCOUNTER — Other Ambulatory Visit: Payer: Self-pay | Admitting: Gynecology

## 2016-05-20 ENCOUNTER — Encounter: Payer: Self-pay | Admitting: Gynecology

## 2016-05-20 DIAGNOSIS — R799 Abnormal finding of blood chemistry, unspecified: Secondary | ICD-10-CM

## 2016-05-20 LAB — URINALYSIS W MICROSCOPIC + REFLEX CULTURE
BILIRUBIN URINE: NEGATIVE
Bacteria, UA: NONE SEEN [HPF]
CASTS: NONE SEEN [LPF]
CRYSTALS: NONE SEEN [HPF]
Glucose, UA: NEGATIVE
Hgb urine dipstick: NEGATIVE
Ketones, ur: NEGATIVE
Leukocytes, UA: NEGATIVE
NITRITE: NEGATIVE
PH: 5.5 (ref 5.0–8.0)
Protein, ur: NEGATIVE
RBC / HPF: NONE SEEN RBC/HPF (ref <=2)
SPECIFIC GRAVITY, URINE: 1.017 (ref 1.001–1.035)
Squamous Epithelial / LPF: NONE SEEN [HPF] (ref <=5)
WBC UA: NONE SEEN WBC/HPF (ref <=5)
Yeast: NONE SEEN [HPF]

## 2016-05-20 LAB — PAP IG W/ RFLX HPV ASCU

## 2016-05-20 LAB — FOLLICLE STIMULATING HORMONE: FSH: 81.8 m[IU]/mL

## 2016-05-25 ENCOUNTER — Telehealth: Payer: Self-pay | Admitting: *Deleted

## 2016-05-25 MED ORDER — FLUOXETINE HCL 10 MG PO CAPS: 10.0000 mg | ORAL_CAPSULE | Freq: Every day | ORAL | 11 refills | Status: DC

## 2016-05-25 NOTE — Telephone Encounter (Signed)
Pt was also informed with the my chart message Dr.Fontaine sent to read that has not been read.   "Your lab results were normal noting elevated FSH which looks like menopausal change. As long as you're not having significant hot flushes and sweats that we want to talk about hormone replacement then we will monitor. Your Pap smear returned normal.

## 2016-05-25 NOTE — Telephone Encounter (Signed)
Pt had annual in 05/19/16 needs refill on Prozac was taking 20 mg, would like to decrease to 10 mg states she at the point where she think a lower the dose is good, wants to try this so she can wean off eventually. Please advise

## 2016-05-25 NOTE — Telephone Encounter (Signed)
Okay to refill Prozac 10 mg daily #30 refill 12

## 2016-05-25 NOTE — Telephone Encounter (Signed)
Pt aware, Rx sent. 

## 2016-05-26 ENCOUNTER — Other Ambulatory Visit: Payer: Self-pay | Admitting: Gynecology

## 2016-06-26 ENCOUNTER — Other Ambulatory Visit: Payer: Self-pay | Admitting: Family Medicine

## 2016-06-26 DIAGNOSIS — R109 Unspecified abdominal pain: Secondary | ICD-10-CM

## 2016-06-29 ENCOUNTER — Ambulatory Visit
Admission: RE | Admit: 2016-06-29 | Discharge: 2016-06-29 | Disposition: A | Discharge status: Home / Self Care | Payer: No Typology Code available for payment source | Source: Ambulatory Visit | Attending: Family Medicine | Admitting: Family Medicine

## 2016-06-29 DIAGNOSIS — R109 Unspecified abdominal pain: Secondary | ICD-10-CM

## 2016-06-30 ENCOUNTER — Other Ambulatory Visit: Payer: No Typology Code available for payment source

## 2016-06-30 ENCOUNTER — Other Ambulatory Visit: Payer: Self-pay | Admitting: *Deleted

## 2016-06-30 MED ORDER — FLUOXETINE HCL 10 MG PO CAPS: 10.0000 mg | ORAL_CAPSULE | Freq: Every day | ORAL | 3 refills | Status: DC

## 2016-06-30 NOTE — Telephone Encounter (Signed)
Pharmacy requested 90 day supply on Prozac 10 mg. Rx sent. TF approved Rx on 05/25/16 with 11 refills.

## 2016-07-10 ENCOUNTER — Emergency Department (HOSPITAL_BASED_OUTPATIENT_CLINIC_OR_DEPARTMENT_OTHER): Payer: No Typology Code available for payment source

## 2016-07-10 ENCOUNTER — Encounter (HOSPITAL_BASED_OUTPATIENT_CLINIC_OR_DEPARTMENT_OTHER): Payer: Self-pay

## 2016-07-10 ENCOUNTER — Emergency Department (HOSPITAL_BASED_OUTPATIENT_CLINIC_OR_DEPARTMENT_OTHER)
Admission: EM | Admit: 2016-07-10 | Discharge: 2016-07-10 | Disposition: A | Discharge status: Home / Self Care | Payer: No Typology Code available for payment source | Attending: Emergency Medicine | Admitting: Emergency Medicine

## 2016-07-10 DIAGNOSIS — R55 Syncope and collapse: Secondary | ICD-10-CM | POA: Diagnosis not present

## 2016-07-10 DIAGNOSIS — Z5181 Encounter for therapeutic drug level monitoring: Secondary | ICD-10-CM | POA: Insufficient documentation

## 2016-07-10 DIAGNOSIS — M546 Pain in thoracic spine: Secondary | ICD-10-CM | POA: Diagnosis present

## 2016-07-10 LAB — RAPID URINE DRUG SCREEN, HOSP PERFORMED
Amphetamines: NOT DETECTED
Barbiturates: NOT DETECTED
Benzodiazepines: NOT DETECTED
Cocaine: NOT DETECTED
OPIATES: NOT DETECTED
Tetrahydrocannabinol: NOT DETECTED

## 2016-07-10 LAB — BASIC METABOLIC PANEL
Anion gap: 4 — ABNORMAL LOW (ref 5–15)
BUN: 23 mg/dL — AB (ref 6–20)
CO2: 26 mmol/L (ref 22–32)
Calcium: 9 mg/dL (ref 8.9–10.3)
Chloride: 110 mmol/L (ref 101–111)
Creatinine, Ser: 0.79 mg/dL (ref 0.44–1.00)
GFR calc Af Amer: >60 mL/min (ref >60)
Glucose, Bld: 98 mg/dL (ref 65–99)
Potassium: 4 mmol/L (ref 3.5–5.1)
Sodium: 140 mmol/L (ref 135–145)

## 2016-07-10 LAB — CBC
HCT: 43 % (ref 36.0–46.0)
Hemoglobin: 14.5 g/dL (ref 12.0–15.0)
MCH: 31.7 pg (ref 26.0–34.0)
MCHC: 33.7 g/dL (ref 30.0–36.0)
MCV: 93.9 fL (ref 78.0–100.0)
PLATELETS: 165 10*3/uL (ref 150–400)
RBC: 4.58 MIL/uL (ref 3.87–5.11)
RDW: 11.6 % (ref 11.5–15.5)
WBC: 7.2 10*3/uL (ref 4.0–10.5)

## 2016-07-10 LAB — URINALYSIS, ROUTINE W REFLEX MICROSCOPIC
BILIRUBIN URINE: NEGATIVE
GLUCOSE, UA: NEGATIVE mg/dL
HGB URINE DIPSTICK: NEGATIVE
KETONES UR: NEGATIVE mg/dL
Leukocytes, UA: NEGATIVE
NITRITE: NEGATIVE
PH: 5.5 (ref 5.0–8.0)
Protein, ur: NEGATIVE mg/dL
Specific Gravity, Urine: 1.021 (ref 1.005–1.030)

## 2016-07-10 LAB — I-STAT CG4 LACTIC ACID, ED: LACTIC ACID, VENOUS: 1.33 mmol/L (ref 0.5–1.9)

## 2016-07-10 LAB — MAGNESIUM: Magnesium: 2.3 mg/dL (ref 1.7–2.4)

## 2016-07-10 NOTE — ED Triage Notes (Signed)
Pt c/o lt upper back pain x1wk, states woke up this am with severe pain felt weak, lowered herself to the floor and past out for unknown time, no loss of urine or bowls. That had this same episode 2wks ago from severe pain to calf. States took a flexeril and ibuprofen with no relief; no acute distress noted; pt states pain on movement, feels like a pulled muscle.

## 2016-07-10 NOTE — ED Notes (Signed)
ED Provider at bedside. 

## 2016-07-10 NOTE — ED Provider Notes (Signed)
[place] [facility] Provider Note   CSN: [id number] Arrival date & time: 07/10/16  0532     History   Chief Complaint Chief Complaint  Patient presents with  . Back Pain    HPI Melanie Valenzuela is a 56 y.o. female.  HPI Patient presents emergency department complains of syncope after developing severe acute pain in her left flank and left side.  She's been having pain in her left side and left flank for most the week and believes that she injured this during a boxing class.  No injury or trauma.  She's tried muscle relaxants and has had some improvement.  She reports a similar episode occurred 2 weeks ago when she had severe pain in her right calf which resulted in a single episode which was witnessed by family member.  Family member reports that she was out for 15-20 seconds and then came back to normal mental status without a postictal period.  The patient reports that she has had episodes like this throughout her life usually associated with pain.  As a child she had an EEG which was normal.  She never has a postictal..  No urinary incontinence.  No tongue biting.  Denies weakness of her arms or legs.  No headache at this time.  Denies chest pain but does report some pain in the left lateral abdomen.  No back pain.  No new weakness in legs.  No new medications.  She does use nutritional supplements in the AM.  No preceding chest pain or palpitations   Past Medical History:  Diagnosis Date  . HGSIL (high grade squamous intraepithelial dysplasia) 2006  . HPV (human papilloma virus) infection   . Migraine    with menses    Patient Active Problem List   Diagnosis Date Noted  . HPV (human papilloma virus) infection   . Migraine   . HGSIL (high grade squamous intraepithelial dysplasia)     Past Surgical History:  Procedure Laterality Date  . CERVICAL CONE BIOPSY  2006  . INTRAUTERINE DEVICE INSERTION  06/29/2013   Mirena  . KNEE SURGERY     right, knee scope X3    OB  History    Gravida Para Term Preterm AB Living   2 2 2     2    SAB TAB Ectopic Multiple Live Births                   Home Medications    Prior to Admission medications   Medication Sig Start Date End Date Taking? Authorizing Provider  FLUoxetine (PROZAC) 10 MG capsule Take 1 capsule (10 mg total) by mouth daily. 06/30/16   [name], MD  ibuprofen (ADVIL,MOTRIN) 200 MG tablet Take 200 mg by mouth every 6 (six) hours as needed.      Historical Provider, MD  MELATONIN PO Take by mouth.    Historical Provider, MD  SUMAtriptan (IMITREX) 100 MG tablet Take 1 tablet (100 mg total) by mouth every 2 (two) hours as needed for migraine. Not exceed 2 tablets in 24 hours 12/05/15   [name], MD    Family History Family History  Problem Relation Age of Onset  . Breast cancer Mother     precancerous cells in breast--40's  . Breast cancer Maternal Aunt     40's  . Breast cancer Maternal Grandmother     age unknown  . Breast cancer Maternal Aunt     PRE-CANCEROUS CELLS--40's  . Breast cancer  Maternal Aunt     40's  . Breast cancer Maternal Aunt     40's    Social History Social History  Substance Use Topics  . Smoking status: Never Smoker  . Smokeless tobacco: Never Used  . Alcohol use No     Comment: rare     Allergies   Patient has no known allergies.   Review of Systems Review of Systems  All other systems reviewed and are negative.    Physical Exam Updated Vital Signs BP 120/83 (BP Location: Right Arm)   Pulse 73   Temp 97.9 F (36.6 C) (Oral)   Resp 18   SpO2 99%   Physical Exam  Constitutional: She is oriented to person, place, and time. She appears well-developed and well-nourished. No distress.  HENT:  Head: Normocephalic and atraumatic.  Eyes: EOM are normal.  Neck: Normal range of motion.  Cardiovascular: Normal rate, regular rhythm and normal heart sounds.   Pulmonary/Chest: Effort normal and breath sounds normal.  Abdominal: Soft.  She exhibits no distension. There is no tenderness.  Musculoskeletal: Normal range of motion.  Neurological: She is alert and oriented to person, place, and time.  Skin: Skin is warm and dry.  Psychiatric: She has a normal mood and affect. Judgment normal.  Nursing note and vitals reviewed.    ED Treatments / Results  Labs (all labs ordered are listed, but only abnormal results are displayed) Labs Reviewed  BASIC METABOLIC PANEL - Abnormal; Notable for the following:       Result Value   BUN 23 (*)    Anion gap 4 (*)    All other components within normal limits  URINALYSIS, ROUTINE W REFLEX MICROSCOPIC  CBC  RAPID URINE DRUG SCREEN, HOSP PERFORMED  MAGNESIUM  I-STAT CG4 LACTIC ACID, ED    EKG  EKG Interpretation  Date/Time:  Friday July 10 2016 06:44:14 EDT Ventricular Rate:  66 PR Interval:    QRS Duration: 121 QT Interval:  448 QTC Calculation: 470 R Axis:   62 Text Interpretation:  Sinus rhythm Nonspecific intraventricular conduction delay No old tracing to compare Confirmed by Celica Kotowski  MD, Lennette Bihari ([id number]) on 07/10/2016 6:57:33 AM Also confirmed by Venora Maples  MD, Lennette Bihari ([id number]), editor [place]  CCT, BEVERLY ([id number])  on 07/10/2016 7:12:52 AM       Radiology Dg Chest 2 View  Result Date: 07/10/2016 CLINICAL DATA:  56 year old female with chest pain on the left side. EXAM: CHEST  2 VIEW COMPARISON:  Chest radiograph dated 06/24/2013 FINDINGS: The heart size and mediastinal contours are within normal limits. Both lungs are clear. The visualized skeletal structures are unremarkable. IMPRESSION: No active cardiopulmonary disease. Electronically Signed   By: Anner Crete M.D.   On: 07/10/2016 06:46    Procedures Procedures (including critical care time)  Medications Ordered in ED Medications - No data to display   Initial Impression / Assessment and Plan / ED Course  I have reviewed the triage vital signs and the nursing notes.  Pertinent labs & imaging results that  were available during my care of the patient were reviewed by me and considered in my medical decision making (see chart for details).     7:17 AM Patient is overall well-appearing.  Doubt seizure.  No postictal period.  No tongue biting.  Normal neuro exam.  Workup in emergency department without significant abnormality.  Discharge home in good condition.  Primary care follow-up.  She understands return to the ER for new or worsening  symptoms  Final Clinical Impressions(s) / ED Diagnoses   Final diagnoses:  Syncope and collapse  Acute left-sided thoracic back pain    New Prescriptions New Prescriptions   No medications on file     Jola Schmidt, MD 07/10/16 [phone]

## 2016-07-14 ENCOUNTER — Other Ambulatory Visit: Payer: Self-pay | Admitting: Family Medicine

## 2016-07-14 DIAGNOSIS — R55 Syncope and collapse: Secondary | ICD-10-CM

## 2016-07-16 ENCOUNTER — Ambulatory Visit
Admission: RE | Admit: 2016-07-16 | Discharge: 2016-07-16 | Disposition: A | Discharge status: Home / Self Care | Payer: No Typology Code available for payment source | Source: Ambulatory Visit | Attending: Family Medicine | Admitting: Family Medicine

## 2016-07-16 DIAGNOSIS — R55 Syncope and collapse: Secondary | ICD-10-CM

## 2016-09-08 ENCOUNTER — Encounter: Payer: Self-pay | Admitting: Women's Health

## 2016-09-08 ENCOUNTER — Telehealth: Payer: Self-pay | Admitting: *Deleted

## 2016-09-08 ENCOUNTER — Ambulatory Visit (INDEPENDENT_AMBULATORY_CARE_PROVIDER_SITE_OTHER): Payer: No Typology Code available for payment source | Admitting: Women's Health

## 2016-09-08 VITALS — BP 122/86

## 2016-09-08 DIAGNOSIS — Z113 Encounter for screening for infections with a predominantly sexual mode of transmission: Secondary | ICD-10-CM | POA: Diagnosis not present

## 2016-09-08 DIAGNOSIS — B9689 Other specified bacterial agents as the cause of diseases classified elsewhere: Secondary | ICD-10-CM

## 2016-09-08 DIAGNOSIS — N920 Excessive and frequent menstruation with regular cycle: Secondary | ICD-10-CM | POA: Diagnosis not present

## 2016-09-08 DIAGNOSIS — N760 Acute vaginitis: Secondary | ICD-10-CM

## 2016-09-08 LAB — WET PREP FOR TRICH, YEAST, CLUE
Trich, Wet Prep: NONE SEEN
Yeast Wet Prep HPF POC: NONE SEEN

## 2016-09-08 MED ORDER — METRONIDAZOLE 500 MG PO TABS: 500.0000 mg | ORAL_TABLET | Freq: Two times a day (BID) | ORAL | 0 refills | Status: DC

## 2016-09-08 NOTE — Progress Notes (Signed)
Presents with complaint of pink spotting 3 days ago with scant brown discharge after. No bleeding today. 06/2013 Mirena IUD amenorrheic for 1 year with elevated FSH for 2 years. States had monthly cycles with the IUD prior to April 2017. New partner in the past month. Had intercourse 2 weeks ago and did use Plan B emergency contraception for fear of pregnancy. Had intercourse this past weekend and had spotting after.  Denies pain with intercourse, urinary symptoms, abdominal pain, or fever. 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  Exam: Slightly anxious. Abdomen soft nontender, external genitalia +1 cystocele asymptomatic, speculum exam scant watery discharge, no visible blood, cervix clean, IUD strings visible. wet prep positive for many clues, TNTC bacteria. GC/Chlamydia culture taken.  Bimanual no CMT or adnexal tenderness.  Bacteria vaginosis STD screen Postmenopausal with Mirena IUD  Plan: Flagyl 500 twice daily for 7 days, alcohol precautions reviewed. Instructed to call if further spotting or discharge. GC/Chlamydia culture pending, HIV, hep B, C, RPR. Reviewed no need for Plan B emergency contraception, that may have caused the spotting. Reassurance given.

## 2016-09-08 NOTE — Telephone Encounter (Signed)
Pt called left message in triage voicemail with question, I called and left message for pt to call me.

## 2016-09-08 NOTE — Patient Instructions (Signed)
Bacterial Vaginosis Bacterial vaginosis is a vaginal infection that occurs when the normal balance of bacteria in the vagina is disrupted. It results from an overgrowth of certain bacteria. This is the most common vaginal infection among women ages 15-44. Because bacterial vaginosis increases your risk for STIs (sexually transmitted infections), getting treated can help reduce your risk for chlamydia, gonorrhea, herpes, and HIV (human immunodeficiency virus). Treatment is also important for preventing complications in pregnant women, because this condition can cause an early (premature) delivery. What are the causes? This condition is caused by an increase in harmful bacteria that are normally present in small amounts in the vagina. However, the reason that the condition develops is not fully understood. What increases the risk? The following factors may make you more likely to develop this condition:  Having a new sexual partner or multiple sexual partners.  Having unprotected sex.  Douching.  Having an intrauterine device (IUD).  Smoking.  Drug and alcohol abuse.  Taking certain antibiotic medicines.  Being pregnant.  You cannot get bacterial vaginosis from toilet seats, bedding, swimming pools, or contact with objects around you. What are the signs or symptoms? Symptoms of this condition include:  Grey or white vaginal discharge. The discharge can also be watery or foamy.  A fish-like odor with discharge, especially after sexual intercourse or during menstruation.  Itching in and around the vagina.  Burning or pain with urination.  Some women with bacterial vaginosis have no signs or symptoms. How is this diagnosed? This condition is diagnosed based on:  Your medical history.  A physical exam of the vagina.  Testing a sample of vaginal fluid under a microscope to look for a large amount of bad bacteria or abnormal cells. Your health care provider may use a cotton swab  or a small wooden spatula to collect the sample.  How is this treated? This condition is treated with antibiotics. These may be given as a pill, a vaginal cream, or a medicine that is put into the vagina (suppository). If the condition comes back after treatment, a second round of antibiotics may be needed. Follow these instructions at home: Medicines  Take over-the-counter and prescription medicines only as told by your health care provider.  Take or use your antibiotic as told by your health care provider. Do not stop taking or using the antibiotic even if you start to feel better. General instructions  If you have a female sexual partner, tell her that you have a vaginal infection. She should see her health care provider and be treated if she has symptoms. If you have a female sexual partner, he does not need treatment.  During treatment: ? Avoid sexual activity until you finish treatment. ? Do not douche. ? Avoid alcohol as directed by your health care provider. ? Avoid breastfeeding as directed by your health care provider.  Drink enough water and fluids to keep your urine clear or pale yellow.  Keep the area around your vagina and rectum clean. ? Wash the area daily with warm water. ? Wipe yourself from front to back after using the toilet.  Keep all follow-up visits as told by your health care provider. This is important. How is this prevented?  Do not douche.  Wash the outside of your vagina with warm water only.  Use protection when having sex. This includes latex condoms and dental dams.  Limit how many sexual partners you have. To help prevent bacterial vaginosis, it is best to have sex with just   one partner (monogamous).  Make sure you and your sexual partner are tested for STIs.  Wear cotton or cotton-lined underwear.  Avoid wearing tight pants and pantyhose, especially during summer.  Limit the amount of alcohol that you drink.  Do not use any products that  contain nicotine or tobacco, such as cigarettes and e-cigarettes. If you need help quitting, ask your health care provider.  Do not use illegal drugs. Where to find more information:  Centers for Disease Control and Prevention: www.cdc.gov/std  American Sexual Health Association (ASHA): www.ashastd.org  U.S. Department of Health and Human Services, Office on Women's Health: www.womenshealth.gov/ or https://www.womenshealth.gov/a-z-topics/bacterial-vaginosis Contact a health care provider if:  Your symptoms do not improve, even after treatment.  You have more discharge or pain when urinating.  You have a fever.  You have pain in your abdomen.  You have pain during sex.  You have vaginal bleeding between periods. Summary  Bacterial vaginosis is a vaginal infection that occurs when the normal balance of bacteria in the vagina is disrupted.  Because bacterial vaginosis increases your risk for STIs (sexually transmitted infections), getting treated can help reduce your risk for chlamydia, gonorrhea, herpes, and HIV (human immunodeficiency virus). Treatment is also important for preventing complications in pregnant women, because the condition can cause an early (premature) delivery.  This condition is treated with antibiotic medicines. These may be given as a pill, a vaginal cream, or a medicine that is put into the vagina (suppository). This information is not intended to replace advice given to you by your health care provider. Make sure you discuss any questions you have with your health care provider. Document Released: 04/06/2005 Document Revised: 12/21/2015 Document Reviewed: 12/21/2015 Elsevier Interactive Patient Education  2017 [employer]  

## 2016-09-09 ENCOUNTER — Encounter: Payer: Self-pay | Admitting: Women's Health

## 2016-09-09 LAB — HEPATITIS B SURFACE ANTIGEN: Hepatitis B Surface Ag: NEGATIVE

## 2016-09-09 LAB — GC/CHLAMYDIA PROBE AMP
CT Probe RNA: NOT DETECTED
GC Probe RNA: NOT DETECTED

## 2016-09-09 LAB — HEPATITIS C ANTIBODY: HCV AB: NEGATIVE

## 2016-09-09 LAB — RPR

## 2016-09-09 LAB — HIV ANTIBODY (ROUTINE TESTING W REFLEX): HIV 1&2 Ab, 4th Generation: NONREACTIVE

## 2016-09-16 ENCOUNTER — Ambulatory Visit: Payer: No Typology Code available for payment source | Admitting: Gynecology

## 2016-09-16 ENCOUNTER — Telehealth: Payer: Self-pay | Admitting: *Deleted

## 2016-09-16 MED ORDER — FLUCONAZOLE 150 MG PO TABS: 150.0000 mg | ORAL_TABLET | Freq: Once | ORAL | 0 refills | Status: AC

## 2016-09-16 NOTE — Telephone Encounter (Signed)
Pt completed dose of flagyl 500 mg x 7 days, now has vaginal itching asked if diflucan tablet could be sent to pharmacy? Please advise

## 2016-09-16 NOTE — Telephone Encounter (Signed)
Ok for diflucan 150mg 1 dose 

## 2016-09-16 NOTE — Telephone Encounter (Signed)
Rx sent,

## 2016-09-28 ENCOUNTER — Encounter: Payer: Self-pay | Admitting: Gynecology

## 2016-09-28 ENCOUNTER — Ambulatory Visit (INDEPENDENT_AMBULATORY_CARE_PROVIDER_SITE_OTHER): Payer: No Typology Code available for payment source | Admitting: Gynecology

## 2016-09-28 VITALS — BP 118/76

## 2016-09-28 DIAGNOSIS — N930 Postcoital and contact bleeding: Secondary | ICD-10-CM

## 2016-09-28 NOTE — Progress Notes (Signed)
    Melanie Valenzuela 07-14-1960 [id number]        56 y.o.  [id number] presents complaining of another episode of postcoital bleeding.  Saw Izora Gala 09/08/2016 for same complaint after no bleeding for over a year. Had STD screening to include serum and GC/Chlamydia of cervix all negative. Was treated for bacterial vaginosis with Flagyl. Has Mirena IUD and history of [place] in the 25s. Had episode of bleeding a year ago with sonohysterogram 10/2015 showing thin endometrial echo at 1.7 mm with endometrial biopsy showing atrophic endometrium. Having no pain or other symptoms other than some spotting after intercourse 2 episodes.  Past medical history,surgical history, problem list, medications, allergies, family history and social history were all reviewed and documented in the EPIC chart.  Directed ROS with pertinent positives and negatives documented in the history of present illness/assessment and plan.  Exam: [name] assistant Vitals:   09/28/16 0931  BP: 118/76   General appearance:  Normal Abdomen soft nontender without masses guarding rebound Pelvic external BUS vagina normal. Cervix normal. IUD string visualized. Uterus normal size midline mobile nontender. Adnexa without masses or tenderness.  Assessment/Plan:  56 y.o. [id number] with history as above. Exam is normal. Reviewed with patient negative workup within the past year. I think her spotting probably is atrophic in origin and is exacerbated with intercourse. Do not feel any further evaluation at this point is warranted. Options to include low-dose estrogen to stabilize the endometrium was discussed. Patient's going to monitor for now and will follow up if the spotting continues after intercourse and will consider a short course of estrogen. She's having no other menopausal symptoms to warrant long-term estrogen use at this point.  Greater than 50% of my time was spent in direct face to face counseling and coordination of care with the  patient.     [name] MD, 10:04 AM 09/28/2016

## 2016-09-28 NOTE — Patient Instructions (Signed)
Follow up if the postcoital bleeding continues

## 2017-02-02 ENCOUNTER — Other Ambulatory Visit: Payer: Self-pay | Admitting: Gynecology

## 2017-02-02 DIAGNOSIS — Z1231 Encounter for screening mammogram for malignant neoplasm of breast: Secondary | ICD-10-CM

## 2017-03-18 ENCOUNTER — Ambulatory Visit
Admission: RE | Admit: 2017-03-18 | Discharge: 2017-03-18 | Disposition: A | Discharge status: Home / Self Care | Payer: No Typology Code available for payment source | Source: Ambulatory Visit | Attending: Gynecology | Admitting: Gynecology

## 2017-03-18 DIAGNOSIS — Z1231 Encounter for screening mammogram for malignant neoplasm of breast: Secondary | ICD-10-CM

## 2017-05-11 ENCOUNTER — Other Ambulatory Visit: Payer: Self-pay | Admitting: Physician Assistant

## 2017-05-27 ENCOUNTER — Encounter: Payer: Self-pay | Admitting: Gynecology

## 2017-05-27 ENCOUNTER — Ambulatory Visit (INDEPENDENT_AMBULATORY_CARE_PROVIDER_SITE_OTHER): Payer: No Typology Code available for payment source | Admitting: Gynecology

## 2017-05-27 VITALS — BP 116/74 | Ht 62.0 in | Wt 138.0 lb

## 2017-05-27 DIAGNOSIS — Z30431 Encounter for routine checking of intrauterine contraceptive device: Secondary | ICD-10-CM

## 2017-05-27 DIAGNOSIS — Z01419 Encounter for gynecological examination (general) (routine) without abnormal findings: Secondary | ICD-10-CM

## 2017-05-27 LAB — CBC WITH DIFFERENTIAL/PLATELET
BASOS PCT: 0.5 %
Basophils Absolute: 28 cells/uL (ref 0–200)
EOS PCT: 2.7 %
Eosinophils Absolute: 151 cells/uL (ref 15–500)
HEMATOCRIT: 38.2 % (ref 35.0–45.0)
Hemoglobin: 13.2 g/dL (ref 11.7–15.5)
LYMPHS ABS: 1170 {cells}/uL (ref 850–3900)
MCH: 31 pg (ref 27.0–33.0)
MCHC: 34.6 g/dL (ref 32.0–36.0)
MCV: 89.7 fL (ref 80.0–100.0)
MPV: 10.7 fL (ref 7.5–12.5)
Monocytes Relative: 6.8 %
Neutro Abs: 3870 cells/uL (ref 1500–7800)
Neutrophils Relative %: 69.1 %
PLATELETS: 224 10*3/uL (ref 140–400)
RBC: 4.26 10*6/uL (ref 3.80–5.10)
RDW: 12 % (ref 11.0–15.0)
TOTAL LYMPHOCYTE: 20.9 %
WBC mixed population: 381 cells/uL (ref 200–950)
WBC: 5.6 10*3/uL (ref 3.8–10.8)

## 2017-05-27 LAB — COMPREHENSIVE METABOLIC PANEL
AG Ratio: 1.9 (calc) (ref 1.0–2.5)
ALBUMIN MSPROF: 4.6 g/dL (ref 3.6–5.1)
ALT: 29 U/L (ref 6–29)
AST: 24 U/L (ref 10–35)
Alkaline phosphatase (APISO): 87 U/L (ref 33–130)
BUN: 12 mg/dL (ref 7–25)
CO2: 28 mmol/L (ref 20–32)
CREATININE: 0.72 mg/dL (ref 0.50–1.05)
Calcium: 9.5 mg/dL (ref 8.6–10.4)
Chloride: 105 mmol/L (ref 98–110)
GLOBULIN: 2.4 g/dL (ref 1.9–3.7)
Glucose, Bld: 82 mg/dL (ref 65–99)
POTASSIUM: 4.1 mmol/L (ref 3.5–5.3)
SODIUM: 141 mmol/L (ref 135–146)
TOTAL PROTEIN: 7 g/dL (ref 6.1–8.1)
Total Bilirubin: 0.5 mg/dL (ref 0.2–1.2)

## 2017-05-27 NOTE — Patient Instructions (Signed)
Follow-up in 1 year for annual exam, sooner as needed. 

## 2017-05-27 NOTE — Addendum Note (Signed)
Addended by: Nelva Nay on: 05/27/2017 10:11 AM   Modules accepted: Orders

## 2017-05-27 NOTE — Progress Notes (Signed)
    Melanie Valenzuela May 08, 1960 [id number]        57 y.o.  [id number] for annual gynecologic exam.    Past medical history,surgical history, problem list, medications, allergies, family history and social history were all reviewed and documented as reviewed in the EPIC chart.  ROS:  Performed with pertinent positives and negatives included in the history, assessment and plan.   Additional significant findings : None   Exam: [name] assistant Vitals:   05/27/17 0901  BP: 116/74  Weight: 138 lb (62.6 kg)  Height: 5\' 2"  (1.575 m)   Body mass index is 25.24 kg/m.  General appearance:  Normal affect, orientation and appearance. Skin: Grossly normal HEENT: Without gross lesions.  No cervical or supraclavicular adenopathy. Thyroid normal.  Lungs:  Clear without wheezing, rales or rhonchi Cardiac: RR, without RMG Abdominal:  Soft, nontender, without masses, guarding, rebound, organomegaly or hernia Breasts:  Examined lying and sitting without masses, retractions, discharge or axillary adenopathy. Pelvic:  Ext, BUS, Vagina: Normal with mild atrophic changes  Cervix: Normal with mild atrophic changes.  IUD string visualized  Uterus: Anteverted, normal size, shape and contour, midline and mobile nontender   Adnexa: Without masses or tenderness    Anus and perineum: Normal   Rectovaginal: Normal sphincter tone without palpated masses or tenderness.    Assessment/Plan:  57 y.o. G27P2002 female for annual gynecologic exam.   1. Postmenopausal.  Remains amenorrheic with her Mirena IUD.  Has some hot flushes.  [place] 80s last year.  Not interested in HRT.  No bleeding at all. 2. Mirena IUD 06/2013.  We will plan on removing at 5-year interval. 3. Mammography 02/2017.  Continue with annual mammography when due.  SBE monthly reviewed.  Breast exam normal today. 4. Colonoscopy 2014.  Repeat at their recommended interval. 5. Pap smear 2018.  Pap smear done today at patient request.  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 6. Health maintenance.  CBC and CMP done today.  Lipid profile and TSH normal last year.  Follow-up in 1 year, sooner as needed.   [name] MD, 9:21 AM 05/27/2017

## 2017-06-01 LAB — PAP IG W/ RFLX HPV ASCU

## 2017-06-04 ENCOUNTER — Telehealth: Payer: Self-pay | Admitting: *Deleted

## 2017-06-04 NOTE — Telephone Encounter (Signed)
Pt called requesting lab results on 05/27/17, I called and left on voicemail lab all normal.

## 2017-06-17 ENCOUNTER — Other Ambulatory Visit: Payer: Self-pay | Admitting: Physician Assistant

## 2017-07-05 ENCOUNTER — Telehealth: Payer: Self-pay | Admitting: *Deleted

## 2017-07-05 MED ORDER — SUMATRIPTAN SUCCINATE 100 MG PO TABS: 100.0000 mg | ORAL_TABLET | ORAL | 1 refills | Status: DC | PRN

## 2017-07-05 NOTE — Telephone Encounter (Signed)
Pt called requesting refill on Imtrex 100 mg tablet last filled on 2018. Please advise

## 2017-07-05 NOTE — Telephone Encounter (Signed)
Rx sent, pt informed. 

## 2017-07-05 NOTE — Telephone Encounter (Signed)
Okay for Imitrex 100 mg either # 9 or 10 pending on what is the standard for them to prescribe.  Refill x1 Sig: 1 p.o. with onset of headache.  May repeat after 2 hours if headache persists.  Not to exceed 2 pills/day

## 2017-09-07 ENCOUNTER — Ambulatory Visit (INDEPENDENT_AMBULATORY_CARE_PROVIDER_SITE_OTHER): Payer: No Typology Code available for payment source | Admitting: Women's Health

## 2017-09-07 ENCOUNTER — Encounter: Payer: Self-pay | Admitting: Women's Health

## 2017-09-07 VITALS — BP 114/66

## 2017-09-07 DIAGNOSIS — N950 Postmenopausal bleeding: Secondary | ICD-10-CM | POA: Diagnosis not present

## 2017-09-07 DIAGNOSIS — N920 Excessive and frequent menstruation with regular cycle: Secondary | ICD-10-CM

## 2017-09-07 LAB — WET PREP FOR TRICH, YEAST, CLUE

## 2017-09-07 NOTE — Progress Notes (Signed)
57 year old D WF G2 P2 presents with complaint of questionable vaginal irritation, odor, mild discomfort with intercourse, brown spotting.  [facility] which has decreased the discharge and wanted to be sure infection cleared.  Menopausal x2 years, Mirena IUD placed 06/2013.  States is worried about endometrial/uterine cancer due to the spotting.  Same partner for 1 year.  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  Denies urinary symptoms, abdominal pain, fever, or nausea.  Exam: Appears well, slightly anxious.  No CVAT.  Abdomen soft, nontender, external genitalia within normal limits, no visible discharge or erythema noted, speculum exam no visible brown/bloody discharge, erythema, mild atrophy.  Bimanual no CMT or adnexal tenderness.  Wet prep negative.  GC/Chlamydia culture taken.  Resolved BV Questionable spotting Mirena IUD postmenopausally  Plan: Reviewed normality of exam, no visible blood, reviewed Mirena IUD will help keep lining of uterus thin to decrease chances for endometrial cancer.  Instructed to call if any further bleeding.  Finish Flagyl prescription as prescribed.  Will check ultrasound, will check endometrial lining, if thin no further treatment, if lining is thick - will proceed with sonohysterogram with Dr. Phineas Real.

## 2017-09-08 LAB — NO CULTURE INDICATED

## 2017-09-08 LAB — URINALYSIS, COMPLETE W/RFL CULTURE
Bacteria, UA: NONE SEEN /HPF
Bilirubin Urine: NEGATIVE
Glucose, UA: NEGATIVE
HGB URINE DIPSTICK: NEGATIVE
HYALINE CAST: NONE SEEN /LPF
Leukocyte Esterase: NEGATIVE
NITRITES URINE, INITIAL: NEGATIVE
Protein, ur: NEGATIVE
RBC / HPF: NONE SEEN /HPF (ref 0–2)
SPECIFIC GRAVITY, URINE: 1.025 (ref 1.001–1.03)
WBC, UA: NONE SEEN /HPF (ref 0–5)

## 2017-09-08 LAB — C. TRACHOMATIS/N. GONORRHOEAE RNA
C. trachomatis RNA, TMA: NOT DETECTED
N. gonorrhoeae RNA, TMA: NOT DETECTED

## 2017-09-28 ENCOUNTER — Ambulatory Visit (INDEPENDENT_AMBULATORY_CARE_PROVIDER_SITE_OTHER): Payer: No Typology Code available for payment source | Admitting: Women's Health

## 2017-09-28 ENCOUNTER — Other Ambulatory Visit: Payer: Self-pay | Admitting: Women's Health

## 2017-09-28 ENCOUNTER — Encounter: Payer: Self-pay | Admitting: Women's Health

## 2017-09-28 ENCOUNTER — Ambulatory Visit (INDEPENDENT_AMBULATORY_CARE_PROVIDER_SITE_OTHER): Payer: No Typology Code available for payment source

## 2017-09-28 VITALS — BP 120/82

## 2017-09-28 DIAGNOSIS — N950 Postmenopausal bleeding: Secondary | ICD-10-CM | POA: Diagnosis not present

## 2017-09-28 DIAGNOSIS — N920 Excessive and frequent menstruation with regular cycle: Secondary | ICD-10-CM

## 2017-09-28 NOTE — Progress Notes (Signed)
57 year old D WF G2, P2 presents for follow-up ultrasound.  Was seen 3 weeks ago with postmenopausal spotting/bleeding, Mirena IUD placed 2015.  Second episode of spotting.  Had small amount of red/brown spotting in the past after intercourse.  Same partner with negative STD screen.  Reports concerns regarding endometrial cancer.  History of anxiety on Prozac.  Denies any other concerns today of discharge, urinary symptoms, abdominal pain or spotting.  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  Exam: Appears well.  Ultrasound: T/V anteverted uterus with IUD seen in normal position.  Right ovary not seen on today's exam due to bowel shadowing.  Left ovary normal.  No apparent right or left adnexal mass.  Negative cul-de-sac.  Endometrium 1.6 mm.  Normal GYN ultrasound Postmenopausal's postcoital spotting  Plan: Reviewed normality of ultrasound and thin endometrium.  Also reviewed Mirena IUD will help protect against endometrial cancer and hyperplasia.  Will watch spotting at this time, currently having none.

## 2018-01-02 ENCOUNTER — Other Ambulatory Visit: Payer: Self-pay | Admitting: Gynecology

## 2018-03-29 ENCOUNTER — Other Ambulatory Visit: Payer: Self-pay | Admitting: Gynecology

## 2018-04-28 ENCOUNTER — Other Ambulatory Visit: Payer: Self-pay | Admitting: Gynecology

## 2018-04-28 DIAGNOSIS — Z1231 Encounter for screening mammogram for malignant neoplasm of breast: Secondary | ICD-10-CM

## 2018-05-21 DIAGNOSIS — R87610 Atypical squamous cells of undetermined significance on cytologic smear of cervix (ASC-US): Secondary | ICD-10-CM

## 2018-05-21 DIAGNOSIS — R87810 Cervical high risk human papillomavirus (HPV) DNA test positive: Secondary | ICD-10-CM

## 2018-05-21 HISTORY — DX: Cervical high risk human papillomavirus (HPV) DNA test positive: R87.810

## 2018-05-21 HISTORY — DX: Atypical squamous cells of undetermined significance on cytologic smear of cervix (ASC-US): R87.610

## 2018-05-26 ENCOUNTER — Ambulatory Visit: Payer: No Typology Code available for payment source

## 2018-06-02 ENCOUNTER — Encounter: Payer: No Typology Code available for payment source | Admitting: Gynecology

## 2018-06-17 ENCOUNTER — Encounter: Payer: Self-pay | Admitting: Gynecology

## 2018-06-17 ENCOUNTER — Ambulatory Visit (INDEPENDENT_AMBULATORY_CARE_PROVIDER_SITE_OTHER): Payer: PRIVATE HEALTH INSURANCE | Admitting: Gynecology

## 2018-06-17 ENCOUNTER — Ambulatory Visit
Admission: RE | Admit: 2018-06-17 | Discharge: 2018-06-17 | Disposition: A | Discharge status: Home / Self Care | Payer: No Typology Code available for payment source | Source: Ambulatory Visit | Attending: Gynecology | Admitting: Gynecology

## 2018-06-17 VITALS — BP 110/70 | Ht 61.0 in | Wt 133.0 lb

## 2018-06-17 DIAGNOSIS — N390 Urinary tract infection, site not specified: Secondary | ICD-10-CM

## 2018-06-17 DIAGNOSIS — Z1231 Encounter for screening mammogram for malignant neoplasm of breast: Secondary | ICD-10-CM

## 2018-06-17 DIAGNOSIS — Z13220 Encounter for screening for lipoid disorders: Secondary | ICD-10-CM | POA: Diagnosis not present

## 2018-06-17 DIAGNOSIS — Z01419 Encounter for gynecological examination (general) (routine) without abnormal findings: Secondary | ICD-10-CM

## 2018-06-17 MED ORDER — NITROFURANTOIN MONOHYD MACRO 100 MG PO CAPS: 100.0000 mg | ORAL_CAPSULE | Freq: Once | ORAL | 2 refills | Status: AC

## 2018-06-17 NOTE — Addendum Note (Signed)
Addended by: Nelva Nay on: 06/17/2018 10:29 AM   Modules accepted: Orders

## 2018-06-17 NOTE — Patient Instructions (Signed)
Take the Macrobid antibiotic with intercourse to prevent UTIs  Follow-up in 1 year for annual exam

## 2018-06-17 NOTE — Progress Notes (Signed)
    Malaiya Paczkowski Quiett 01/13/1961 [id number]        58 y.o.  [id number] for annual gynecologic exam.  She is with postcoital UTI symptoms.  Intermittently after intercourse she will develop dysuria and frequency.  Has been treated for UTIs several times.  Has been taking Macrodantin 1 pill prophylactically and this seems to prevent the UTIs.  Past medical history,surgical history, problem list, medications, allergies, family history and social history were all reviewed and documented as reviewed in the EPIC chart.  ROS:  Performed with pertinent positives and negatives included in the history, assessment and plan.   Additional significant findings : None   Exam: [name] assistant Vitals:   06/17/18 0953  BP: 110/70  Weight: 133 lb (60.3 kg)  Height: 5\' 1"  (1.549 m)   Body mass index is 25.13 kg/m.  General appearance:  Normal affect, orientation and appearance. Skin: Grossly normal HEENT: Without gross lesions.  No cervical or supraclavicular adenopathy. Thyroid normal.  Lungs:  Clear without wheezing, rales or rhonchi Cardiac: RR, without RMG Abdominal:  Soft, nontender, without masses, guarding, rebound, organomegaly or hernia Breasts:  Examined lying and sitting without masses, retractions, discharge or axillary adenopathy. Pelvic:  Ext, BUS, Vagina: Normal with atrophic changes  Cervix: Normal with atrophic changes.  IUD string visualized.  Uterus: Anteverted, normal size, shape and contour, midline and mobile nontender   Adnexa: Without masses or tenderness    Anus and perineum: Normal   Rectovaginal: Normal sphincter tone without palpated masses or tenderness.   Procedure: The cervix was visualized with a speculum and the IUD string was grasped with a [place] forcep, the IUD removed, shown to the patient and discarded.  Assessment/Plan:  58 y.o. G27P2002 female for annual gynecologic exam.   1. Postmenopausal.  No significant menopausal symptoms or any  bleeding. 2. Postcoital UTIs.  We discussed options and ultimately will use Macrobid 100 mg with intercourse.  She will follow-up if this fails and she develops a full UTI.  #30 with 2 refills provided. 3. Mirena IUD 2015.  Due to be removed.  [place] 81 2 years ago.  IUD was removed as above.  Recommended condoms at least for the first several months and as long as she remains amenorrheic then to follow. 4. Pap smear 2019.  Patient requests Pap smear annually understanding current screening guidelines.  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  Pap smear done today. 5. Mammography 05/2018.  Continue with annual mammography next year.  Breast exam normal today. 6. Colonoscopy 2019.  Repeat at their recommended interval. 7. Health maintenance.  Baseline CBC, CMP and lipid profile ordered.  Follow-up 1 year, sooner as needed.   [name] MD, 10:11 AM 06/17/2018

## 2018-06-18 LAB — COMPREHENSIVE METABOLIC PANEL
AG RATIO: 1.8 (calc) (ref 1.0–2.5)
ALT: 14 U/L (ref 6–29)
AST: 22 U/L (ref 10–35)
Albumin: 4.7 g/dL (ref 3.6–5.1)
Alkaline phosphatase (APISO): 79 U/L (ref 37–153)
BUN: 14 mg/dL (ref 7–25)
CO2: 26 mmol/L (ref 20–32)
Calcium: 9.8 mg/dL (ref 8.6–10.4)
Chloride: 106 mmol/L (ref 98–110)
Creat: 0.92 mg/dL (ref 0.50–1.05)
GLOBULIN: 2.6 g/dL (ref 1.9–3.7)
Glucose, Bld: 97 mg/dL (ref 65–99)
Potassium: 5 mmol/L (ref 3.5–5.3)
Sodium: 142 mmol/L (ref 135–146)
Total Bilirubin: 0.6 mg/dL (ref 0.2–1.2)
Total Protein: 7.3 g/dL (ref 6.1–8.1)

## 2018-06-18 LAB — CBC WITH DIFFERENTIAL/PLATELET
ABSOLUTE MONOCYTES: 333 {cells}/uL (ref 200–950)
Basophils Absolute: 29 cells/uL (ref 0–200)
Basophils Relative: 0.6 %
EOS ABS: 113 {cells}/uL (ref 15–500)
Eosinophils Relative: 2.3 %
HCT: 40.8 % (ref 35.0–45.0)
Hemoglobin: 14.2 g/dL (ref 11.7–15.5)
Lymphs Abs: 1294 cells/uL (ref 850–3900)
MCH: 32.1 pg (ref 27.0–33.0)
MCHC: 34.8 g/dL (ref 32.0–36.0)
MCV: 92.1 fL (ref 80.0–100.0)
MPV: 11.3 fL (ref 7.5–12.5)
Monocytes Relative: 6.8 %
Neutro Abs: 3131 cells/uL (ref 1500–7800)
Neutrophils Relative %: 63.9 %
Platelets: 212 10*3/uL (ref 140–400)
RBC: 4.43 10*6/uL (ref 3.80–5.10)
RDW: 12 % (ref 11.0–15.0)
Total Lymphocyte: 26.4 %
WBC: 4.9 10*3/uL (ref 3.8–10.8)

## 2018-06-18 LAB — LIPID PANEL
Cholesterol: 193 mg/dL (ref <200)
HDL: 64 mg/dL (ref >=50)
LDL Cholesterol (Calc): 115 mg/dL (calc) — ABNORMAL HIGH
Non-HDL Cholesterol (Calc): 129 mg/dL (calc) (ref <130)
Total CHOL/HDL Ratio: 3 (calc) (ref <5.0)
Triglycerides: 55 mg/dL (ref <150)

## 2018-06-20 ENCOUNTER — Encounter: Payer: Self-pay | Admitting: Gynecology

## 2018-06-22 ENCOUNTER — Encounter: Payer: Self-pay | Admitting: Gynecology

## 2018-06-22 LAB — PAP IG W/ RFLX HPV ASCU

## 2018-06-22 LAB — HUMAN PAPILLOMAVIRUS, HIGH RISK: HPV DNA High Risk: DETECTED — AB

## 2018-06-24 ENCOUNTER — Ambulatory Visit (INDEPENDENT_AMBULATORY_CARE_PROVIDER_SITE_OTHER): Payer: PRIVATE HEALTH INSURANCE | Admitting: Gynecology

## 2018-06-24 ENCOUNTER — Encounter: Payer: Self-pay | Admitting: Gynecology

## 2018-06-24 VITALS — BP 118/76

## 2018-06-24 DIAGNOSIS — N870 Mild cervical dysplasia: Secondary | ICD-10-CM

## 2018-06-24 DIAGNOSIS — R87610 Atypical squamous cells of undetermined significance on cytologic smear of cervix (ASC-US): Secondary | ICD-10-CM

## 2018-06-24 DIAGNOSIS — R87810 Cervical high risk human papillomavirus (HPV) DNA test positive: Secondary | ICD-10-CM | POA: Diagnosis not present

## 2018-06-24 NOTE — Patient Instructions (Signed)
Office will call you with biopsy results 

## 2018-06-24 NOTE — Addendum Note (Signed)
Addended by: [name] on: 06/24/2018 12:35 PM   Modules accepted: Orders

## 2018-06-24 NOTE — Progress Notes (Signed)
    Melanie Valenzuela 07/22/1960 [id number]        58 y.o.  [id number] presents for colposcopy with most recent Pap smear showing ASCUS positive high risk HPV screen.  History of cone biopsy in 2012 done elsewhere for dysplasia.  Follow-up Pap smear 2012 showed ASCUS negative high risk HPV.  Pap smears 2013, 2014, 2016 and 2017 were normal with negative HPV.  Pap smears 2018 and 2019 were negative with no HPV screen done.  Past medical history,surgical history, problem list, medications, allergies, family history and social history were all reviewed and documented in the EPIC chart.  Directed ROS with pertinent positives and negatives documented in the history of present illness/assessment and plan.  Exam: [name] assistant Vitals:   06/24/18 1027  BP: 118/76   General appearance:  Normal Abdomen soft nontender without masses guarding rebound Pelvic external BUS vagina normal.  Cervix normal.  Uterus normal size midline mobile nontender.  Adnexa without masses or tenderness.  Colposcopy performed after acetic acid cleanse was adequate with no abnormalities seen.  ECC performed.  Patient tolerated well.  Physical Exam  Genitourinary:        Assessment/Plan:  58 y.o. [id number] with ASCUS positive high risk HPV.  The patient and I had an extensive discussion about dysplasia, high-grade/low-grade, progression/regression and the positive HPV results.  She will follow-up for the biopsy results.  If normal or low-grade planned expectant management with follow-up Pap smear.  Patient is very nervous and would prefer follow-up Pap smear in 6 months instead of 1 year.  If ECC shows significant atypia I discussed possible need for LEEP.  Patient will follow-up for ECC results.    [name] MD, 11:02 AM 06/24/2018

## 2018-06-28 ENCOUNTER — Encounter: Payer: Self-pay | Admitting: Gynecology

## 2018-06-28 LAB — TISSUE SPECIMEN

## 2018-06-28 LAB — PATHOLOGY

## 2018-11-13 ENCOUNTER — Emergency Department (HOSPITAL_BASED_OUTPATIENT_CLINIC_OR_DEPARTMENT_OTHER)
Admission: EM | Admit: 2018-11-13 | Discharge: 2018-11-13 | Disposition: A | Discharge status: Home / Self Care | Payer: No Typology Code available for payment source | Attending: Emergency Medicine | Admitting: Emergency Medicine

## 2018-11-13 ENCOUNTER — Other Ambulatory Visit: Payer: Self-pay

## 2018-11-13 ENCOUNTER — Encounter (HOSPITAL_BASED_OUTPATIENT_CLINIC_OR_DEPARTMENT_OTHER): Payer: Self-pay | Admitting: Emergency Medicine

## 2018-11-13 DIAGNOSIS — R55 Syncope and collapse: Secondary | ICD-10-CM | POA: Diagnosis not present

## 2018-11-13 DIAGNOSIS — Z79899 Other long term (current) drug therapy: Secondary | ICD-10-CM | POA: Diagnosis not present

## 2018-11-13 LAB — COMPREHENSIVE METABOLIC PANEL
ALT: 31 U/L (ref 0–44)
AST: 30 U/L (ref 15–41)
Albumin: 4.4 g/dL (ref 3.5–5.0)
Alkaline Phosphatase: 69 U/L (ref 38–126)
Anion gap: 9 (ref 5–15)
BUN: 19 mg/dL (ref 6–20)
CO2: 24 mmol/L (ref 22–32)
Calcium: 9 mg/dL (ref 8.9–10.3)
Chloride: 106 mmol/L (ref 98–111)
Creatinine, Ser: 0.94 mg/dL (ref 0.44–1.00)
GFR calc Af Amer: >60 mL/min (ref >60)
GFR calc non Af Amer: >60 mL/min (ref >60)
Glucose, Bld: 156 mg/dL — ABNORMAL HIGH (ref 70–99)
Potassium: 3.8 mmol/L (ref 3.5–5.1)
Sodium: 139 mmol/L (ref 135–145)
Total Bilirubin: 0.5 mg/dL (ref 0.3–1.2)
Total Protein: 6.9 g/dL (ref 6.5–8.1)

## 2018-11-13 LAB — CBC WITH DIFFERENTIAL/PLATELET
Abs Immature Granulocytes: 0.02 10*3/uL (ref 0.00–0.07)
Basophils Absolute: 0 10*3/uL (ref 0.0–0.1)
Basophils Relative: 0 %
Eosinophils Absolute: 0 10*3/uL (ref 0.0–0.5)
Eosinophils Relative: 0 %
HCT: 40.9 % (ref 36.0–46.0)
Hemoglobin: 13.4 g/dL (ref 12.0–15.0)
Immature Granulocytes: 0 %
Lymphocytes Relative: 8 %
Lymphs Abs: 0.5 10*3/uL — ABNORMAL LOW (ref 0.7–4.0)
MCH: 31.5 pg (ref 26.0–34.0)
MCHC: 32.8 g/dL (ref 30.0–36.0)
MCV: 96 fL (ref 80.0–100.0)
Monocytes Absolute: 0.2 10*3/uL (ref 0.1–1.0)
Monocytes Relative: 3 %
Neutro Abs: 5.6 10*3/uL (ref 1.7–7.7)
Neutrophils Relative %: 89 %
Platelets: 160 10*3/uL (ref 150–400)
RBC: 4.26 MIL/uL (ref 3.87–5.11)
RDW: 11.9 % (ref 11.5–15.5)
WBC: 6.4 10*3/uL (ref 4.0–10.5)
nRBC: 0 % (ref 0.0–0.2)

## 2018-11-13 MED ORDER — ONDANSETRON HCL 4 MG/2ML IJ SOLN
4.0000 mg | Freq: Once | INTRAMUSCULAR | Status: AC
  Administered 2018-11-13: 4 mg via INTRAVENOUS
  Filled 2018-11-13: qty 2

## 2018-11-13 MED ORDER — SODIUM CHLORIDE 0.9 % IV BOLUS
1000.0000 mL | Freq: Once | INTRAVENOUS | Status: AC
  Administered 2018-11-13: 1000 mL via INTRAVENOUS

## 2018-11-13 NOTE — ED Triage Notes (Addendum)
States worked [employer] hard" yesterday and during the night had a back spasm and then "passed out" x 2    while in bed . Also states whenever she has pain she passes out

## 2018-11-13 NOTE — ED Notes (Signed)
Given po fluids 

## 2018-11-13 NOTE — ED Notes (Signed)
ED Provider at bedside. 

## 2018-11-13 NOTE — Discharge Instructions (Signed)
Drink plenty of fluids today to stay hydrated and ibuprofen as needed for pain and spasm

## 2018-11-13 NOTE — ED Provider Notes (Signed)
[facility] EMERGENCY DEPARTMENT Provider Note   CSN: [id number] Arrival date & time: 11/13/18  5284     History   Chief Complaint Chief Complaint  Patient presents with  . Weakness    HPI Melanie Valenzuela is a 58 y.o. female.     Patient is a healthy 58 year old female with no significant past medical history except for she easily syncopized is with pain presenting today with 2 episodes of syncope since 4 AM this morning.  Patient states she had a 1 hour and 20-minute workout yesterday with a lot of heavy lifting and back exercises.  In the middle of the night she developed a severe back cramp which caused her to syncopized.  She states usually she starts feeling tingly and dizzy and then will get in a safe spot and then wake up.  Shortly after that she attempted to walk to the bathroom and started feeling the same way and laid down on the floor and had an additional syncopal event.  She states after waking up from the syncopal event she had several episodes of vomiting.  She currently is just feeling generally weak and a little bit tingly.  She denies any weakness to the lower extremities or upper extremities.  She has some mild soreness in her back which she describes as a muscular soreness.  She denies chest pain, palpitations, shortness of breath, abdominal pain.  She did not have any injury during her syncopal events that she was able to lay down both times.  She takes no medications regularly.  She has no prior heart history.  The history is provided by the patient.  Weakness Severity:  Moderate Onset quality:  Gradual Timing:  Constant Progression:  Unchanged   Past Medical History:  Diagnosis Date  . ASCUS with positive high risk HPV cervical 05/2018   Colposcopy ECC with low-grade atypia  . Migraine    with menses    Patient Active Problem List   Diagnosis Date Noted  . BV (bacterial vaginosis) 09/08/2016  . HPV (human papilloma virus) infection   .  Migraine   . HGSIL (high grade squamous intraepithelial dysplasia)     Past Surgical History:  Procedure Laterality Date  . CARPAL TUNNEL RELEASE     Bilateral  . CERVICAL CONE BIOPSY  2006  . INTRAUTERINE DEVICE INSERTION  06/29/2013   Mirena  . KNEE SURGERY     right, knee scope X3     OB History    Gravida  2   Para  2   Term  2   Preterm      AB      Living  2     SAB      TAB      Ectopic      Multiple      Live Births               Home Medications    Prior to Admission medications   Medication Sig Start Date End Date Taking? Authorizing Provider  FLUoxetine (PROZAC) 10 MG capsule Take 10 mg by mouth daily.   Yes [provider]  SUMAtriptan (IMITREX) 100 MG tablet TAKE 1 TAB EVERY 2 HOURS AS NEEDED FOR MIGRAINE.REPEAT IN 2 HOURS IF HEADACHE PERSISTS OR RECURS. 01/03/18   Fontaine, Belinda Block, MD    Family History Family History  Problem Relation Age of Onset  . Breast cancer Mother        precancerous cells in  breast--40's  . Breast cancer Maternal Aunt        40's  . Breast cancer Maternal Grandmother        age unknown  . Breast cancer Maternal Aunt        PRE-CANCEROUS CELLS--40's  . Breast cancer Maternal Aunt        40's  . Breast cancer Maternal Aunt        40's    Social History Social History   Tobacco Use  . Smoking status: Never Smoker  . Smokeless tobacco: Never Used  Substance Use Topics  . Alcohol use: No    Alcohol/week: 0.0 standard drinks  . Drug use: No     Allergies   Patient has no known allergies.   Review of Systems Review of Systems  Neurological: Positive for weakness.  All other systems reviewed and are negative.    Physical Exam Updated Vital Signs BP 114/73 (BP Location: Right Arm)   Pulse 75   Temp 98.1 F (36.7 C) (Oral)   Resp 18   Ht 5\' 2"  (1.575 m)   Wt 59 kg   LMP 09/11/2015 (LMP Unknown)   SpO2 100%   BMI 23.78 kg/m   Physical Exam Vitals signs and nursing note  reviewed.  Constitutional:      General: She is not in acute distress.    Appearance: She is well-developed and normal weight.  HENT:     Head: Normocephalic and atraumatic.  Eyes:     Pupils: Pupils are equal, round, and reactive to light.  Cardiovascular:     Rate and Rhythm: Normal rate and regular rhythm.     Heart sounds: Normal heart sounds. No murmur. No friction rub.  Pulmonary:     Effort: Pulmonary effort is normal.     Breath sounds: Normal breath sounds. No wheezing or rales.  Abdominal:     General: Bowel sounds are normal. There is no distension.     Palpations: Abdomen is soft.     Tenderness: There is no abdominal tenderness. There is no guarding or rebound.  Musculoskeletal: Normal range of motion.        General: Tenderness present.     Comments: No edema.  Mild tenderness in the paralumbar region no midline tenderness  Skin:    General: Skin is warm and dry.     Findings: No rash.  Neurological:     General: No focal deficit present.     Mental Status: She is alert and oriented to person, place, and time. Mental status is at baseline.     Cranial Nerves: No cranial nerve deficit.  Psychiatric:        Mood and Affect: Mood normal.        Behavior: Behavior normal.        Thought Content: Thought content normal.      ED Treatments / Results  Labs (all labs ordered are listed, but only abnormal results are displayed) Labs Reviewed  CBC WITH DIFFERENTIAL/PLATELET - Abnormal; Notable for the following components:      Result Value   Lymphs Abs 0.5 (*)    All other components within normal limits  COMPREHENSIVE METABOLIC PANEL - Abnormal; Notable for the following components:   Glucose, Bld 156 (*)    All other components within normal limits    EKG EKG Interpretation  Date/Time:  Sunday November 13 2018 08:00:53 EDT Ventricular Rate:  72 PR Interval:    QRS Duration: 72 QT Interval:  576 QTC Calculation: 631 R Axis:   61 Text Interpretation:  Sinus  rhythm Borderline abnrm T, anterolateral leads new Prolonged QT interval Confirmed by Blanchie Dessert [phone]) on 11/13/2018 8:11:45 AM   Radiology No results found.  Procedures Procedures (including critical care time)  Medications Ordered in ED Medications  sodium chloride 0.9 % bolus 1,000 mL (has no administration in time range)  ondansetron (ZOFRAN) injection 4 mg (has no administration in time range)     Initial Impression / Assessment and Plan / ED Course  I have reviewed the triage vital signs and the nursing notes.  Pertinent labs & imaging results that were available during my care of the patient were reviewed by me and considered in my medical decision making (see chart for details).        Patient with 2 syncopal events at home after having a back spasm.  Patient is otherwise healthy with no known medical problems.  Patient states this happens intermittently when she gets severe pain.  Seems more like a vasovagal event.  Patient did have vomiting after she awoke from the syncope and still is complaining of some nausea.  EKG shows prolonged QT but no other acute changes.  Blood pressure initially was 114/73 laying down.  She denies any chest pain or shortness of breath.  She denies any palpitations.  Low suspicion for ACS, PE or dissection.  She has no abdominal pain and is no longer having menses.  Low suspicion for AAA or other acute abdominal process.  Patient will be given IV fluids and antiemetics.  Will ensure no evidence of anemia or electrolyte disturbance.  9:41 AM Patient's EKG with prolonged QT but do not feel that was the cause for her syncope today.  After IV fluids and Zofran patient is feeling better.  She is able to stand and ambulate throughout the department and states she feels back to herself.  CBC and CMP are within normal limits.  Will discharge home.  Final Clinical Impressions(s) / ED Diagnoses   Final diagnoses:  Vasovagal syncope    ED  Discharge Orders    None       Blanchie Dessert, MD 11/13/18 [phone]

## 2018-12-14 ENCOUNTER — Other Ambulatory Visit: Payer: Self-pay

## 2018-12-15 ENCOUNTER — Encounter: Payer: Self-pay | Admitting: Gynecology

## 2018-12-15 ENCOUNTER — Ambulatory Visit (INDEPENDENT_AMBULATORY_CARE_PROVIDER_SITE_OTHER): Payer: PRIVATE HEALTH INSURANCE | Admitting: Gynecology

## 2018-12-15 VITALS — BP 118/74

## 2018-12-15 DIAGNOSIS — R87810 Cervical high risk human papillomavirus (HPV) DNA test positive: Secondary | ICD-10-CM

## 2018-12-15 DIAGNOSIS — R87610 Atypical squamous cells of undetermined significance on cytologic smear of cervix (ASC-US): Secondary | ICD-10-CM | POA: Diagnosis not present

## 2018-12-15 NOTE — Progress Notes (Signed)
    Melanie Valenzuela 11-Aug-1960 [id number]        58 y.o.  [id number] presents for repeat Pap smear.  Pap smear 05/2018 showing ASCUS positive high risk HPV screen.  Colposcopy 06/2018 was normal with ECC showing HPV changes.  She has a history of cone biopsy in 2012 done elsewhere for dysplasia. Follow-up Pap smear 2012 showed ASCUS negative high risk HPV. Pap smears 2013, 2014, 2016 and 2017 were normal with negative HPV. Pap smears 2018 and 2019 were negative with no HPV screen done.  Options for repeating her Pap smear 1 year interval versus 6 months discussed and the patient is very uncomfortable waiting the full year.  Past medical history,surgical history, problem list, medications, allergies, family history and social history were all reviewed and documented in the EPIC chart.  Directed ROS with pertinent positives and negatives documented in the history of present illness/assessment and plan.  Exam: [name] assistant Vitals:   12/15/18 1003  BP: 118/74   General appearance:  Normal Abdomen soft nontender without masses guarding rebound Pelvic external BUS vagina with atrophic changes.  Cervix with atrophic changes.  Pap smear/HPV.  Uterus normal size midline mobile nontender.  Adnexa without masses or tenderness.  Assessment/Plan:  58 y.o. [id number] with history as above.  Pap smear/HPV done.  Patient will follow-up for results.    [name] MD, 10:13 AM 12/15/2018

## 2018-12-15 NOTE — Patient Instructions (Signed)
Follow-up for Pap smear results. 

## 2018-12-15 NOTE — Addendum Note (Signed)
Addended by: Nelva Nay on: 12/15/2018 10:27 AM   Modules accepted: Orders

## 2018-12-19 ENCOUNTER — Encounter: Payer: Self-pay | Admitting: Gynecology

## 2018-12-19 LAB — PAP IG AND HPV HIGH-RISK: HPV DNA High Risk: DETECTED — AB

## 2019-01-02 NOTE — Telephone Encounter (Signed)
Spoke with patient and informed her. Recall placed. 

## 2019-01-12 ENCOUNTER — Encounter: Payer: Self-pay | Admitting: Gynecology

## 2019-01-12 ENCOUNTER — Ambulatory Visit (INDEPENDENT_AMBULATORY_CARE_PROVIDER_SITE_OTHER): Payer: PRIVATE HEALTH INSURANCE | Admitting: Gynecology

## 2019-01-12 ENCOUNTER — Other Ambulatory Visit: Payer: Self-pay

## 2019-01-12 VITALS — BP 110/78

## 2019-01-12 DIAGNOSIS — R87810 Cervical high risk human papillomavirus (HPV) DNA test positive: Secondary | ICD-10-CM | POA: Diagnosis not present

## 2019-01-12 NOTE — Patient Instructions (Signed)
Follow-up in February when due for annual exam.

## 2019-01-12 NOTE — Progress Notes (Signed)
    Melanie Valenzuela 18-Jun-1960 [id number]        58 y.o.  G2P2002 presents to discuss her Pap smear history and positive high risk HPV screen.  She has a history of cone biopsy 2012 for dysplasia done elsewhere.  Pap smears from 2013 through 2017 were normal with negative HPV.  Pap smears in 2018 and 2019 were negative.  No HPV screen done at that time.  Pap smear 05/2018 showed ASCUS with positive high-risk HPV.  Colposcopy was normal with ECC showing HPV changes.  Follow-up Pap smear 11/2018 at her request showed normal cytology with positive high risk HPV screen.  Past medical history,surgical history, problem list, medications, allergies, family history and social history were all reviewed and documented in the EPIC chart.  Directed ROS with pertinent positives and negatives documented in the history of present illness/assessment and plan.  Exam: Vitals:   01/12/19 0826  BP: 110/78   General appearance:  Normal   Assessment/Plan:  58 y.o. [id number] with HPV, normal cytology.  We discussed in detail HPV and the natural history.  We discussed dysplasia from mild to high-grade changes.  She wanted to know if there is anything she could do to accelerate clearance of the HPV as well as whether treatments such as LEEP would be beneficial.  We discussed that other than healthy lifestyle to support a healthy immune system nothing at this point can be done other than waiting and monitoring.  Importance of annual Pap smear with HPV screen until negative discussed.  The patient is due for her annual exam in February and plans to repeat her Pap smear/HPV screen at that time.  I spent a total of 15 face-to-face minutes with the patient, over 50% was spent counseling and coordination of care.   [name] MD, 8:52 AM 01/12/2019

## 2019-01-17 ENCOUNTER — Encounter: Payer: Self-pay | Admitting: Gynecology

## 2019-03-02 ENCOUNTER — Ambulatory Visit: Payer: PRIVATE HEALTH INSURANCE | Admitting: Interventional Cardiology

## 2019-04-25 NOTE — Progress Notes (Signed)
Cardiology Office Note:    Date:  04/27/2019   ID:  Melanie Valenzuela, DOB 09/06/1960, MRN [id number]  PCP:  Mayra Neer, MD  Cardiologist:  No primary care provider on file.   Referring MD: Mayra Neer, MD   Chief Complaint  Patient presents with  . Loss of Consciousness  . Advice Only    Prolonged QT    History of Present Illness:    Melanie Valenzuela is a 59 y.o. female with a hx of recurrent syncope and one ECG showing QT prolongation. Referred from Dr. Serita Grammes.  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  I have seen her in the remote past.  She developed recurrent syncope in July after a heavy [employer] workout.  She feels fainting occurred twice while she was recumbent.  The workout was in the summertime, temperature was greater than 90, and she was probably dehydrated.  There was no associated chest pain, shortness of breath, or palpitations.  She has subsequently gone back to her usual exercise program without difficulty.  Previously, syncope is almost always occurred with pain as a stimulus.  She has been previously seen because of palpitations.  She still has these from time to time.  She has never had syncope associated with palpitations.  Past Medical History:  Diagnosis Date  . ASCUS with positive high risk HPV cervical 05/2018   Colposcopy ECC with low-grade atypia.  Follow-up Pap smear 11/2018 showed normal cytology with positive high-risk HPV screen  . Migraine    with menses    Past Surgical History:  Procedure Laterality Date  . CARPAL TUNNEL RELEASE     Bilateral  . CERVICAL CONE BIOPSY  2006  . INTRAUTERINE DEVICE INSERTION  06/29/2013   Mirena  . KNEE SURGERY     right, knee scope X3    Current Medications: Current Meds  Medication Sig  . Ascorbic Acid (VITAMIN C) 1000 MG tablet Take 1,000 mg by mouth daily.  . B Complex Vitamins (VITAMIN B COMPLEX PO) Take 1 tablet by mouth daily.  Marland Kitchen BIOTIN PO Take 1 tablet by mouth daily.  Marland Kitchen FLUoxetine (PROZAC)  20 MG capsule Take 20 mg by mouth daily.  . Multiple Vitamin (MULTIVITAMIN) tablet Take 1 tablet by mouth daily.  . SUMAtriptan (IMITREX) 100 MG tablet TAKE 1 TAB EVERY 2 HOURS AS NEEDED FOR MIGRAINE.REPEAT IN 2 HOURS IF HEADACHE PERSISTS OR RECURS.  . Turmeric (QC TUMERIC COMPLEX PO) Take 1 tablet by mouth daily.  Marland Kitchen VITAMIN D PO Take by mouth.  . vitamin E (VITAMIN E) 400 UNIT capsule Take 400 Units by mouth daily.  . [DISCONTINUED] FLUoxetine (PROZAC) 10 MG capsule Take 10 mg by mouth daily.     Allergies:   Patient has no known allergies.   Social History   Socioeconomic History  . Marital status: Single    Spouse name: Not on file  . Number of children: Not on file  . Years of education: Not on file  . Highest education level: Not on file  Occupational History  . Not on file  Tobacco Use  . Smoking status: Never Smoker  . Smokeless tobacco: Never Used  Substance and Sexual Activity  . Alcohol use: No    Alcohol/week: 0.0 standard drinks  . Drug use: No  . Sexual activity: Yes    Partners: Male    Birth control/protection: Post-menopausal    Comment: 1st intercourse 59 yo-Fewer than 5 partners  Other Topics Concern  . Not on file  Social History Narrative  . Not on file   Social Determinants of Health   Financial Resource Strain:   . Difficulty of Paying Living Expenses: Not on file  Food Insecurity:   . Worried About Charity fundraiser in the Last Year: Not on file  . Ran Out of Food in the Last Year: Not on file  Transportation Needs:   . Lack of Transportation (Medical): Not on file  . Lack of Transportation (Non-Medical): Not on file  Physical Activity:   . Days of Exercise per Week: Not on file  . Minutes of Exercise per Session: Not on file  Stress:   . Feeling of Stress : Not on file  Social Connections:   . Frequency of Communication with Friends and Family: Not on file  . Frequency of Social Gatherings with Friends and Family: Not on file  . Attends  Religious Services: Not on file  . Active Member of Clubs or Organizations: Not on file  . Attends Archivist Meetings: Not on file  . Marital Status: Not on file     Family History: The patient's family history includes Breast cancer in her maternal aunt, maternal aunt, maternal aunt, maternal aunt, maternal grandmother, and mother; Hypertension in her father.  ROS:   Please see the history of present illness.    Lives alone.  Exercises on a regular basis to stay fit.  Chronic palpitations for years.  Prior cardiac evaluations have included monitoring and echocardiograms.  All other systems reviewed and are negative.  EKGs/Labs/Other Studies Reviewed:    The following studies were reviewed today: No new data   Prior history of echocardiography and monitoring at [facility] demonstrating structurally normal heart and PACs on monitor.  EKG:  EKG performed on 04/27/2019, reveals normal sinus rhythm/bradycardia without other abnormality  Recent Labs: 11/13/2018: ALT 31; BUN 19; Creatinine, Ser 0.94; Hemoglobin 13.4; Platelets 160; Potassium 3.8; Sodium 139  Recent Lipid Panel    Component Value Date/Time   CHOL 193 06/17/2018 1013   TRIG 55 06/17/2018 1013   HDL 64 06/17/2018 1013   CHOLHDL 3.0 06/17/2018 1013   VLDL 14 05/19/2016 1602   LDLCALC 115 (H) 06/17/2018 1013    Physical Exam:    VS:  BP 108/68   Pulse 71   Ht 5\' 2"  (1.575 m)   Wt 137 lb 6.4 oz (62.3 kg)   LMP 09/11/2015 (LMP Unknown)   SpO2 98%   BMI 25.13 kg/m     Wt Readings from Last 3 Encounters:  04/27/19 137 lb 6.4 oz (62.3 kg)  11/13/18 130 lb (59 kg)  06/17/18 133 lb (60.3 kg)     GEN: Healthy-appearing. No acute distress HEENT: Normal NECK: No JVD. LYMPHATICS: No lymphadenopathy CARDIAC:  RRR without murmur, gallop, or edema. VASCULAR:  Normal Pulses. No bruits. RESPIRATORY:  Clear to auscultation without rales, wheezing or rhonchi  ABDOMEN: Soft, non-tender, non-distended, No pulsatile  mass, MUSCULOSKELETAL: No deformity  SKIN: Warm and dry NEUROLOGIC:  Alert and oriented x 3 PSYCHIATRIC:  Normal affect   ASSESSMENT:    1. Recurrent syncope   2. Prolonged QT interval   3. Educated about COVID-19 virus infection    PLAN:    In order of problems listed above:  1. Vasovagal mechanism.  The most recent episode that occurred 6 months ago after strenuous physical activity is likely the same.  She has gone back to full activity without any recurrence of symptoms.  No specific work-up at  this time.  We did discuss risk mitigation by considering her age relative to intensity of exercise program.  It will need to decrease somewhat as time goes along.  We also discussed safety measures related to prodrome and the need to sit or lie down if prodrome occurs.  All episodes in the past have been associated with prodrome as well as the most recent episode. 2. Today's EKG is normal.  EKG from July appears normal with the exception of generalized flattened T waves.  It is difficult to tell how a QT interval was measured as there were no clearly defined T waves noted on the tracing.  Likely electrolyte/metabolic disturbance at the particular time. 3. 3W's to avoid COVID-19 infection.  Need to exercise.  Consider being more mindful concerning intensity of exercise and external conditions (call and hot/humid).  History of prior echocardiogram x2.  Get copies of these studies from [facility]   Medication Adjustments/Labs and Tests Ordered: Current medicines are reviewed at length with the patient today.  Concerns regarding medicines are outlined above.  No orders of the defined types were placed in this encounter.  No orders of the defined types were placed in this encounter.   There are no Patient Instructions on file for this visit.   Signed, Sinclair Grooms, MD  04/27/2019 9:57 AM    [place]

## 2019-04-27 ENCOUNTER — Encounter: Payer: Self-pay | Admitting: Interventional Cardiology

## 2019-04-27 ENCOUNTER — Other Ambulatory Visit: Payer: Self-pay

## 2019-04-27 ENCOUNTER — Ambulatory Visit (INDEPENDENT_AMBULATORY_CARE_PROVIDER_SITE_OTHER): Payer: PRIVATE HEALTH INSURANCE | Admitting: Interventional Cardiology

## 2019-04-27 VITALS — BP 108/68 | HR 71 | Ht 62.0 in | Wt 137.4 lb

## 2019-04-27 DIAGNOSIS — Z7189 Other specified counseling: Secondary | ICD-10-CM | POA: Diagnosis not present

## 2019-04-27 DIAGNOSIS — R9431 Abnormal electrocardiogram [ECG] [EKG]: Secondary | ICD-10-CM

## 2019-04-27 DIAGNOSIS — R55 Syncope and collapse: Secondary | ICD-10-CM

## 2019-04-27 NOTE — Patient Instructions (Signed)
Medication Instructions:  Your physician recommends that you continue on your current medications as directed. Please refer to the Current Medication list given to you today.  *If you need a refill on your cardiac medications before your next appointment, please call your pharmacy*  Lab Work: None If you have labs (blood work) drawn today and your tests are completely normal, you will receive your results only by: . MyChart Message (if you have MyChart) OR . A paper copy in the mail If you have any lab test that is abnormal or we need to change your treatment, we will call you to review the results.  Testing/Procedures: None  Follow-Up: At [facility], you and your health needs are our priority.  As part of our continuing mission to provide you with exceptional heart care, we have created designated Provider Care Teams.  These Care Teams include your primary Cardiologist (physician) and Advanced Practice Providers (APPs -  Physician Assistants and Nurse Practitioners) who all work together to provide you with the care you need, when you need it.  Your next appointment:   As needed   The format for your next appointment:   In Person  Provider:   You may see Dr. Henry Smith or one of the following Advanced Practice Providers on your designated Care Team:    Lori Gerhardt, NP  Laura Ingold, NP  Jill McDaniel, NP   Other Instructions   

## 2019-04-28 NOTE — Addendum Note (Signed)
Addended by: Carylon Perches on: 04/28/2019 07:49 AM   Modules accepted: Orders

## 2019-06-21 ENCOUNTER — Other Ambulatory Visit: Payer: Self-pay | Admitting: Family Medicine

## 2019-06-21 ENCOUNTER — Ambulatory Visit (INDEPENDENT_AMBULATORY_CARE_PROVIDER_SITE_OTHER): Payer: PRIVATE HEALTH INSURANCE | Admitting: Women's Health

## 2019-06-21 ENCOUNTER — Other Ambulatory Visit: Payer: Self-pay

## 2019-06-21 ENCOUNTER — Encounter: Payer: Self-pay | Admitting: Women's Health

## 2019-06-21 VITALS — BP 122/80 | Ht 62.0 in | Wt 140.0 lb

## 2019-06-21 DIAGNOSIS — Z01419 Encounter for gynecological examination (general) (routine) without abnormal findings: Secondary | ICD-10-CM

## 2019-06-21 DIAGNOSIS — Z1231 Encounter for screening mammogram for malignant neoplasm of breast: Secondary | ICD-10-CM

## 2019-06-21 MED ORDER — NITROFURANTOIN MACROCRYSTAL 50 MG PO CAPS: ORAL_CAPSULE | ORAL | 0 refills | Status: DC

## 2019-06-21 NOTE — Patient Instructions (Signed)
Nice to see you today Vit d 2000 iu daily Health Maintenance for Postmenopausal Women Menopause is a normal process in which your ability to get pregnant comes to an end. This process happens slowly over many months or years, usually between the ages of 60 and 56. Menopause is complete when you have missed your menstrual periods for 12 months. It is important to talk with your health care provider about some of the most common conditions that affect women after menopause (postmenopausal women). These include heart disease, cancer, and bone loss (osteoporosis). Adopting a healthy lifestyle and getting preventive care can help to promote your health and wellness. The actions you take can also lower your chances of developing some of these common conditions. What should I know about menopause? During menopause, you may get a number of symptoms, such as:  Hot flashes. These can be moderate or severe.  Night sweats.  Decrease in sex drive.  Mood swings.  Headaches.  Tiredness.  Irritability.  Memory problems.  Insomnia. Choosing to treat or not to treat these symptoms is a decision that you make with your health care provider. Do I need hormone replacement therapy?  Hormone replacement therapy is effective in treating symptoms that are caused by menopause, such as hot flashes and night sweats.  Hormone replacement carries certain risks, especially as you become older. If you are thinking about using estrogen or estrogen with progestin, discuss the benefits and risks with your health care provider. What is my risk for heart disease and stroke? The risk of heart disease, heart attack, and stroke increases as you age. One of the causes may be a change in the body's hormones during menopause. This can affect how your body uses dietary fats, triglycerides, and cholesterol. Heart attack and stroke are medical emergencies. There are many things that you can do to help prevent heart disease and  stroke. Watch your blood pressure  High blood pressure causes heart disease and increases the risk of stroke. This is more likely to develop in people who have high blood pressure readings, are of African descent, or are overweight.  Have your blood pressure checked: ? Every 3-5 years if you are 60-97 years of age. ? Every year if you are 20 years old or older. Eat a healthy diet   Eat a diet that includes plenty of vegetables, fruits, low-fat dairy products, and lean protein.  Do not eat a lot of foods that are high in solid fats, added sugars, or sodium. Get regular exercise Get regular exercise. This is one of the most important things you can do for your health. Most adults should:  Try to exercise for at least 150 minutes each week. The exercise should increase your heart rate and make you sweat (moderate-intensity exercise).  Try to do strengthening exercises at least twice each week. Do these in addition to the moderate-intensity exercise.  Spend less time sitting. Even light physical activity can be beneficial. Other tips  Work with your health care provider to achieve or maintain a healthy weight.  Do not use any products that contain nicotine or tobacco, such as cigarettes, e-cigarettes, and chewing tobacco. If you need help quitting, ask your health care provider.  Know your numbers. Ask your health care provider to check your cholesterol and your blood sugar (glucose). Continue to have your blood tested as directed by your health care provider. Do I need screening for cancer? Depending on your health history and family history, you may need to  have cancer screening at different stages of your life. This may include screening for:  Breast cancer.  Cervical cancer.  Lung cancer.  Colorectal cancer. What is my risk for osteoporosis? After menopause, you may be at increased risk for osteoporosis. Osteoporosis is a condition in which bone destruction happens more  quickly than new bone creation. To help prevent osteoporosis or the bone fractures that can happen because of osteoporosis, you may take the following actions:  If you are 19-50 years old, get at least 1,000 mg of calcium and at least 600 mg of vitamin D per day.  If you are older than age 50 but younger than age 70, get at least 1,200 mg of calcium and at least 600 mg of vitamin D per day.  If you are older than age 70, get at least 1,200 mg of calcium and at least 800 mg of vitamin D per day. Smoking and drinking excessive alcohol increase the risk of osteoporosis. Eat foods that are rich in calcium and vitamin D, and do weight-bearing exercises several times each week as directed by your health care provider. How does menopause affect my mental health? Depression may occur at any age, but it is more common as you become older. Common symptoms of depression include:  Low or sad mood.  Changes in sleep patterns.  Changes in appetite or eating patterns.  Feeling an overall lack of motivation or enjoyment of activities that you previously enjoyed.  Frequent crying spells. Talk with your health care provider if you think that you are experiencing depression. General instructions See your health care provider for regular wellness exams and vaccines. This may include:  Scheduling regular health, dental, and eye exams.  Getting and maintaining your vaccines. These include: ? Influenza vaccine. Get this vaccine each year before the flu season begins. ? Pneumonia vaccine. ? Shingles vaccine. ? Tetanus, diphtheria, and pertussis (Tdap) booster vaccine. Your health care provider may also recommend other immunizations. Tell your health care provider if you have ever been abused or do not feel safe at home. Summary  Menopause is a normal process in which your ability to get pregnant comes to an end.  This condition causes hot flashes, night sweats, decreased interest in sex, mood swings,  headaches, or lack of sleep.  Treatment for this condition may include hormone replacement therapy.  Take actions to keep yourself healthy, including exercising regularly, eating a healthy diet, watching your weight, and checking your blood pressure and blood sugar levels.  Get screened for cancer and depression. Make sure that you are up to date with all your vaccines. This information is not intended to replace advice given to you by your health care provider. Make sure you discuss any questions you have with your health care provider. Document Revised: 03/30/2018 Document Reviewed: 03/30/2018 Elsevier Patient Education  2020 Elsevier Inc.  

## 2019-06-21 NOTE — Progress Notes (Signed)
lab

## 2019-06-21 NOTE — Progress Notes (Signed)
Melanie Valenzuela April 08, 1961 [id number]    History:    Presents for annual exam.  Postmenopausal on no HRT with no bleeding.  2020 ASCUS with positive high risk HPV follow-up Pap normal positive high-risk HPV.  2013-2019 normal Paps.  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 Same partner negative STD screen.  Normal mammogram history.  01/2019 - colonoscopy 10-year follow-up Dr. Paulita Fujita at [place].  Macrodantin as needed after intercourse, no UTIs in past year.  Past medical history, past surgical history, family history and social history were all reviewed and documented in the EPIC chart.  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  Exercises daily with weights.  2 children both doing well in their 70s.  ROS:  A ROS was performed and pertinent positives and negatives are included.  Exam:  Vitals:   06/21/19 1608  BP: 122/80  Weight: 140 lb (63.5 kg)  Height: 5\' 2"  (1.575 m)   Body mass index is 25.61 kg/m.   General appearance:  Normal Thyroid:  Symmetrical, normal in size, without palpable masses or nodularity. Respiratory  Auscultation:  Clear without wheezing or rhonchi Cardiovascular  Auscultation:  Regular rate, without rubs, murmurs or gallops  Edema/varicosities:  Not grossly evident Abdominal  Soft,nontender, without masses, guarding or rebound.  Liver/spleen:  No organomegaly noted  Hernia:  None appreciated  Skin  Inspection:  Grossly normal   Breasts: Examined lying and sitting.     Right: Without masses, retractions, discharge or axillary adenopathy.     Left: Without masses, retractions, discharge or axillary adenopathy. Gentitourinary   Inguinal/mons:  Normal without inguinal adenopathy  External genitalia:  Normal  BUS/Urethra/Skene's glands:  Normal  Vagina: +1 cystocele asymptomatic   Cervix:  Normal  Uterus:  normal in size, shape and contour.  Midline and mobile  Adnexa/parametria:     Rt: Without masses or tenderness.   Lt: Without masses  or tenderness.  Anus and perineum: Normal  Digital rectal exam: Normal sphincter tone without palpated masses or tenderness  Assessment/Plan:  59 y.o. D WF G2, P2 for annual exam with no complaints of vaginal discharge, urinary symptoms or abdominal pain.  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  CBC, CMP, lipid panel, vitamin D.  Continue active lifestyle of regular exercise, weight lifting, calcium rich foods, vitamin D 2000 IUs daily.  Self-care leisure activities encouraged.  Macrodantin 50 mg p.o. with intercourse as needed prescription, proper use given and reviewed uses rarely.  SBEs, continue annual 3D screening mammogram.  Pap with HR HPV typing.  Colposcopy if high risk HPV persists.   Huel Cote [facility], 5:10 PM 06/21/2019

## 2019-06-23 LAB — PAP, TP IMAGING W/ HPV RNA, RFLX HPV TYPE 16,18/45: HPV DNA High Risk: DETECTED — AB

## 2019-06-23 LAB — HPV TYPE 16 AND 18/45 RNA
HPV Type 16 RNA: NOT DETECTED
HPV Type 18/45 RNA: NOT DETECTED

## 2019-07-19 ENCOUNTER — Other Ambulatory Visit: Payer: Self-pay

## 2019-07-20 ENCOUNTER — Encounter: Payer: Self-pay | Admitting: Obstetrics and Gynecology

## 2019-07-20 ENCOUNTER — Ambulatory Visit
Admission: RE | Admit: 2019-07-20 | Discharge: 2019-07-20 | Disposition: A | Discharge status: Home / Self Care | Payer: PRIVATE HEALTH INSURANCE | Source: Ambulatory Visit | Attending: Family Medicine | Admitting: Family Medicine

## 2019-07-20 ENCOUNTER — Ambulatory Visit (INDEPENDENT_AMBULATORY_CARE_PROVIDER_SITE_OTHER): Payer: PRIVATE HEALTH INSURANCE | Admitting: Obstetrics and Gynecology

## 2019-07-20 VITALS — BP 120/76

## 2019-07-20 DIAGNOSIS — R87810 Cervical high risk human papillomavirus (HPV) DNA test positive: Secondary | ICD-10-CM

## 2019-07-20 DIAGNOSIS — R87610 Atypical squamous cells of undetermined significance on cytologic smear of cervix (ASC-US): Secondary | ICD-10-CM

## 2019-07-20 DIAGNOSIS — N870 Mild cervical dysplasia: Secondary | ICD-10-CM | POA: Diagnosis not present

## 2019-07-20 DIAGNOSIS — Z1231 Encounter for screening mammogram for malignant neoplasm of breast: Secondary | ICD-10-CM

## 2019-07-20 NOTE — Progress Notes (Signed)
   Melanie Valenzuela  [birth year] [id number]  The patient is a 59 y.o. G2P2002 who presents today for a colposcopy due to ASCUS pap smear with positive high risk HPV on 06/21/2019 (negative for type 16, 18, 45).  She had a history of a LEEP in 2006 with clear margins.  She has a history of cone biopsy 2012 for dysplasia performed elsewhere.   Pap smears from 2013 through 2017 were normal with negative HPV.  Pap smears in 2018 and 2019 were negative.  No HPV screen done at that time.  Pap smear 05/2018 showed ASCUS with positive high-risk HPV.  Colposcopy was normal with ECC showing HPV changes.  Follow-up Pap smear 11/2018 at her request showed normal cytology with positive high risk HPV screen.  She works at the [employer].  Procedure note Colposcopy and endocervical curettage The cervix was visualized with the speculum.  Acetic acid cleanse was performed.  Vaginal sidewalls and cervix were visualized.  There is a minute area of acetowhite change at the 3 o'clock position of the external cervical os which was biopsied with a Tischler biopsy forceps.  In summation zone was visualized in its entirety making today's exam adequate.  Endocervical curettage was performed with a Kevorkian curette.  Application of pressure and silver nitrate cautery resulted in hemostasis.  Patient tolerated the procedure well.  Physical Exam Genitourinary:       [name] present for exam and procedure.  We will continue to follow with close interval Pap smear due to history of recurrent HPV.    Joseph Pierini MD, FACOG 07/20/19

## 2019-07-25 LAB — PATHOLOGY REPORT

## 2019-07-25 LAB — TISSUE PATH REPORT

## 2019-07-26 NOTE — Progress Notes (Signed)
6 months is fine if that is what she is comfortable with.

## 2020-01-25 ENCOUNTER — Ambulatory Visit: Payer: PRIVATE HEALTH INSURANCE | Admitting: Obstetrics and Gynecology

## 2020-01-26 ENCOUNTER — Encounter: Payer: Self-pay | Admitting: Obstetrics and Gynecology

## 2020-01-26 ENCOUNTER — Other Ambulatory Visit: Payer: Self-pay

## 2020-01-26 ENCOUNTER — Ambulatory Visit (INDEPENDENT_AMBULATORY_CARE_PROVIDER_SITE_OTHER): Payer: PRIVATE HEALTH INSURANCE | Admitting: Obstetrics and Gynecology

## 2020-01-26 VITALS — BP 118/76

## 2020-01-26 DIAGNOSIS — Z1151 Encounter for screening for human papillomavirus (HPV): Secondary | ICD-10-CM

## 2020-01-26 DIAGNOSIS — Z124 Encounter for screening for malignant neoplasm of cervix: Secondary | ICD-10-CM | POA: Diagnosis not present

## 2020-01-26 NOTE — Progress Notes (Signed)
   Melanie Valenzuela 07/04/1960 [id number]  SUBJECTIVE:  59 y.o. [id number] female presents for a Pap smear.  Her history is as follows: 07/2019 colposcopy due to ASCUS pap smear with positive high risk HPV on 06/21/2019 (negative for type 16, 18, 45).    Biopsy and ECC both showed koilocytotic atypia. Pap smear 05/2018 showed ASCUS with positive high-risk HPV. Colposcopy was normal with ECC showing HPV changes. Follow-up Pap smear 11/2018 at her request showed normal cytology with positive high risk HPV screen.  Pap smears from 2013 through 2017 were normal with negative HPV. Pap smears in 2018 and 2019 were negative. No HPV screen done at that time.   She had a history of a LEEP in 2006 with clear margins.  She has a history of cone biopsy 2012 for dysplasia performed elsewhere.    Current Outpatient Medications  Medication Sig Dispense Refill  . Ascorbic Acid (VITAMIN C) 1000 MG tablet Take 1,000 mg by mouth daily.    . B Complex Vitamins (VITAMIN B COMPLEX PO) Take 1 tablet by mouth daily.    Marland Kitchen BIOTIN PO Take 1 tablet by mouth daily.    . COLLAGEN PO Take by mouth.    Marland Kitchen FLUoxetine (PROZAC) 20 MG capsule Take 20 mg by mouth daily.    . Multiple Vitamin (MULTIVITAMIN) tablet Take 1 tablet by mouth daily.    . nitrofurantoin (MACRODANTIN) 50 MG capsule Use as needed after intercourse 30 capsule 0  . SUMAtriptan (IMITREX) 100 MG tablet TAKE 1 TAB EVERY 2 HOURS AS NEEDED FOR MIGRAINE.REPEAT IN 2 HOURS IF HEADACHE PERSISTS OR RECURS. 9 tablet 1  . Turmeric (QC TUMERIC COMPLEX PO) Take 1 tablet by mouth daily.    Marland Kitchen VITAMIN D PO Take by mouth.    . vitamin E (VITAMIN E) 400 UNIT capsule Take 400 Units by mouth daily.     No current facility-administered medications for this visit.   Allergies: Patient has no known allergies.  Patient's last menstrual period was 09/11/2015 (lmp unknown).  Past medical history,surgical history, problem list, medications, allergies, family history and social  history were all reviewed and documented as reviewed in the EPIC chart.   OBJECTIVE:  BP 118/76   LMP 09/11/2015 (LMP Unknown)  The patient appears well, alert, oriented, in no distress. PELVIC EXAM: VULVA: normal appearing vulva with no masses, tenderness or lesions, VAGINA: normal appearing vagina with normal color and discharge, no lesions, CERVIX: normal appearing cervix without discharge or lesions, UTERUS: uterus is normal size, shape, consistency and nontender, ADNEXA: normal adnexa in size, nontender and no masses, PAP: Pap smear done today, thin-prep method  Chaperone: [name] present during the examination  ASSESSMENT:  59 y.o. [id number] here for a Pap smear due to history of ASCUS and intermittently positive HPV  PLAN:  We discussed the screening guidelines and the nature of the persistence of HPV.  The patient remains more comfortable with close surveillance follow-up.  Certainly if there is worsening cytology and/or concern of persistence of HPV we may need to look into deeper investigation by cone biopsy or LEEP at some point, but at this point she in her more recent history she has had normal to ASCUS cytology.  We will run today's Pap smear for cytology and HPV cotest and let her know about the results.   Joseph Pierini MD 01/26/20

## 2020-01-30 LAB — PAP IG AND HPV HIGH-RISK: HPV DNA High Risk: NOT DETECTED

## 2020-02-01 ENCOUNTER — Ambulatory Visit: Payer: PRIVATE HEALTH INSURANCE | Admitting: Physician Assistant

## 2020-02-08 ENCOUNTER — Encounter: Payer: Self-pay | Admitting: Dermatology

## 2020-02-08 ENCOUNTER — Other Ambulatory Visit: Payer: Self-pay

## 2020-02-08 ENCOUNTER — Ambulatory Visit (INDEPENDENT_AMBULATORY_CARE_PROVIDER_SITE_OTHER): Payer: PRIVATE HEALTH INSURANCE | Admitting: Dermatology

## 2020-02-08 DIAGNOSIS — D1801 Hemangioma of skin and subcutaneous tissue: Secondary | ICD-10-CM

## 2020-02-08 DIAGNOSIS — L814 Other melanin hyperpigmentation: Secondary | ICD-10-CM

## 2020-02-08 DIAGNOSIS — L821 Other seborrheic keratosis: Secondary | ICD-10-CM

## 2020-02-08 DIAGNOSIS — Z1283 Encounter for screening for malignant neoplasm of skin: Secondary | ICD-10-CM

## 2020-02-08 DIAGNOSIS — L988 Other specified disorders of the skin and subcutaneous tissue: Secondary | ICD-10-CM

## 2020-02-08 MED ORDER — TRETINOIN 0.1 % EX CREA: TOPICAL_CREAM | Freq: Every evening | CUTANEOUS | 8 refills | Status: AC

## 2020-02-09 NOTE — Progress Notes (Signed)
   Follow-Up Visit   Subjective  Melanie Valenzuela is a 59 y.o. female who presents for the following: Annual Exam (left & right temple- + itch, left side of nose x months- no itch no bleed & back- itchy in the same spot all the time).  New spot on nose Location:  Duration:  Quality:  Associated Signs/Symptoms: Modifying Factors:  Severity:  Timing: Context:   Objective  Well appearing patient in no apparent distress; mood and affect are within normal limits.  A full examination was performed including scalp, head, eyes, ears, nose, lips, neck, chest, axillae, abdomen, back, buttocks, bilateral upper extremities, bilateral lower extremities, hands, feet, fingers, toes, fingernails, and toenails. All findings within normal limits unless otherwise noted below.   Assessment & Plan    Encounter for screening for malignant neoplasm of skin Chest - Medial (Center)  Yearly skin check  Seborrheic keratosis Mid Frontal Scalp  Okay to leave if stable  Lentigo Dorsum of Nose  Okay to leave if stable  Hemangioma of skin Right Breast  Benign-okay to leave  Wrinkles  Ordered Medications: tretinoin (RETIN-A) 0.1 % cream     I, Lavonna Monarch, MD, have reviewed all documentation for this visit.  The documentation on 02/09/20 for the exam, diagnosis, procedures, and orders are all accurate and complete.

## 2020-05-30 ENCOUNTER — Ambulatory Visit (INDEPENDENT_AMBULATORY_CARE_PROVIDER_SITE_OTHER): Payer: PRIVATE HEALTH INSURANCE | Admitting: Physician Assistant

## 2020-05-30 ENCOUNTER — Encounter: Payer: Self-pay | Admitting: Physician Assistant

## 2020-05-30 ENCOUNTER — Other Ambulatory Visit: Payer: Self-pay

## 2020-05-30 DIAGNOSIS — L570 Actinic keratosis: Secondary | ICD-10-CM | POA: Diagnosis not present

## 2020-05-30 DIAGNOSIS — L578 Other skin changes due to chronic exposure to nonionizing radiation: Secondary | ICD-10-CM

## 2020-05-30 MED ORDER — TOLAK 4 % EX CREA: 1.0000 "application " | TOPICAL_CREAM | Freq: Every day | CUTANEOUS | 0 refills | Status: DC

## 2020-05-30 NOTE — Patient Instructions (Addendum)
If you do not hear form [facility] by the end of the day today then call them in the morning @ [phone]

## 2020-06-07 ENCOUNTER — Other Ambulatory Visit: Payer: Self-pay | Admitting: Family Medicine

## 2020-06-07 DIAGNOSIS — Z1231 Encounter for screening mammogram for malignant neoplasm of breast: Secondary | ICD-10-CM

## 2020-06-25 ENCOUNTER — Encounter: Payer: Self-pay | Admitting: Physician Assistant

## 2020-06-25 NOTE — Progress Notes (Signed)
   Follow-Up Visit   Subjective  Melanie Valenzuela is a 60 y.o. female who presents for the following: Skin Problem (Wants to talk about PDT).   The following portions of the chart were reviewed this encounter and updated as appropriate:      Objective  Well appearing patient in no apparent distress; mood and affect are within normal limits.  A focused examination was performed including face, neck and chest. Relevant physical exam findings are noted in the Assessment and Plan.  Objective  Dorsum of Nose: Erythematous patches with gritty scale.  Images     Assessment & Plan  AK (actinic keratosis) Dorsum of Nose  Schedule Blue Light PDT for face in 6 weeks.  Fluorouracil (TOLAK) 4 % CREA - Dorsum of Nose  Actinic Damage-diffuse - Severe, chronic, secondary to cumulative UV radiation exposure over time - diffuse scaly erythematous macules and papules with underlying dyspigmentation - Discussed Prescription "Field Treatment" for Severe, Chronic Confluent Actinic Changes with Pre-Cancerous Actinic Keratoses Field treatment involves treatment of an entire area of skin that has confluent Actinic Changes (Sun/ Ultraviolet light damage) and PreCancerous Actinic Keratoses by method of prescription Topical Chemotherapy agents with 4%-fluorouracil .  The purpose is to decrease the number of clinically evident and subclinical PreCancerous lesions to prevent progression to development of skin cancer by chemically destroying early precancer changes that may or may not be visible.  It has been shown to reduce the risk of developing skin cancer in the treated area. As a result of treatment, redness, scaling, crusting, and open sores may occur during treatment course. This method may be used and may have to be used several times to control, suppress and eliminate the PreCancerous changes. Discussed treatment course, expected reaction, and possible side effects. - Recommend daily broad spectrum  sunscreen SPF 30+ to sun-exposed areas, reapply every 2 hours as needed.  - Staying in the shade or wearing long sleeves, sun glasses (UVA+UVB protection) and wide brim hats (4-inch brim around the entire circumference of the hat) are also recommended. - Call for new or changing lesions.   I, Chan Rosasco, PA-C, have reviewed all documentation's for this visit.  The documentation on 06/25/20 for the exam, diagnosis, procedures and orders are all accurate and complete.

## 2020-06-27 ENCOUNTER — Encounter: Payer: Self-pay | Admitting: Physician Assistant

## 2020-06-27 ENCOUNTER — Other Ambulatory Visit: Payer: Self-pay

## 2020-06-27 ENCOUNTER — Ambulatory Visit (INDEPENDENT_AMBULATORY_CARE_PROVIDER_SITE_OTHER): Payer: PRIVATE HEALTH INSURANCE | Admitting: Physician Assistant

## 2020-06-27 DIAGNOSIS — D1800 Hemangioma unspecified site: Secondary | ICD-10-CM | POA: Diagnosis not present

## 2020-06-27 DIAGNOSIS — L821 Other seborrheic keratosis: Secondary | ICD-10-CM

## 2020-06-27 DIAGNOSIS — L570 Actinic keratosis: Secondary | ICD-10-CM

## 2020-07-15 ENCOUNTER — Encounter: Payer: Self-pay | Admitting: Physician Assistant

## 2020-07-15 NOTE — Progress Notes (Signed)
   Follow-Up Visit   Subjective  Melanie Valenzuela is a 60 y.o. female who presents for the following: Follow-up (Patient here today for follow up on her nose she used Tolak per patient she only had about 3 spots come up. Patient would also like her back checked, per patient she feels something but she doesn't know what it is.  Per patient it's not itching, no pain, no bleeding.).   The following portions of the chart were reviewed this encounter and updated as appropriate:      Objective  Well appearing patient in no apparent distress; mood and affect are within normal limits.  All skin waist up examined.  Objective  Left Lower Back: Red raised papule.   Objective  Right Upper Back: Multiple Stuck-on, waxy, tan-brown papules and plaques. --Discussed benign etiology and prognosis.   Objective  Mid Supratip of Nose: Erythematous patch with gritty scale.  Images     Assessment & Plan  Hemangioma, unspecified site Left Lower Back  Ok to leave if stable   Seborrheic keratosis Right Upper Back  Ok to leave if stable   AK (actinic keratosis) Mid Supratip of Nose  1 week post application of Tolak 4 % cream. Let it heal for a few more months and if persistent retreat or biopsy    I, Gentri Guardado, PA-C, have reviewed all documentation's for this visit.  The documentation on 07/15/20 for the exam, diagnosis, procedures and orders are all accurate and complete.

## 2020-08-01 ENCOUNTER — Encounter: Payer: Self-pay | Admitting: Obstetrics and Gynecology

## 2020-08-01 ENCOUNTER — Telehealth: Payer: Self-pay | Admitting: Obstetrics and Gynecology

## 2020-08-01 ENCOUNTER — Ambulatory Visit
Admission: RE | Admit: 2020-08-01 | Discharge: 2020-08-01 | Disposition: A | Discharge status: Home / Self Care | Payer: PRIVATE HEALTH INSURANCE | Source: Ambulatory Visit | Attending: Family Medicine | Admitting: Family Medicine

## 2020-08-01 ENCOUNTER — Ambulatory Visit (INDEPENDENT_AMBULATORY_CARE_PROVIDER_SITE_OTHER): Payer: PRIVATE HEALTH INSURANCE | Admitting: Obstetrics and Gynecology

## 2020-08-01 ENCOUNTER — Other Ambulatory Visit: Payer: Self-pay

## 2020-08-01 ENCOUNTER — Other Ambulatory Visit (HOSPITAL_COMMUNITY)
Admission: RE | Admit: 2020-08-01 | Discharge: 2020-08-01 | Disposition: A | Discharge status: Home / Self Care | Payer: PRIVATE HEALTH INSURANCE | Source: Ambulatory Visit | Attending: Obstetrics and Gynecology | Admitting: Obstetrics and Gynecology

## 2020-08-01 VITALS — BP 120/64 | HR 71 | Ht 61.0 in | Wt 140.0 lb

## 2020-08-01 DIAGNOSIS — Z01419 Encounter for gynecological examination (general) (routine) without abnormal findings: Secondary | ICD-10-CM | POA: Insufficient documentation

## 2020-08-01 DIAGNOSIS — Z1231 Encounter for screening mammogram for malignant neoplasm of breast: Secondary | ICD-10-CM

## 2020-08-01 DIAGNOSIS — Z803 Family history of malignant neoplasm of breast: Secondary | ICD-10-CM

## 2020-08-01 MED ORDER — NITROFURANTOIN MACROCRYSTAL 50 MG PO CAPS: ORAL_CAPSULE | ORAL | 1 refills | Status: DC

## 2020-08-01 NOTE — Telephone Encounter (Signed)
Please make a referral for genetic counseling and possible testing for family history of breast cancer.

## 2020-08-01 NOTE — Telephone Encounter (Signed)
Patient informed with below note, I explained the referral has been placed at [facility] and they will call to schedule.

## 2020-08-01 NOTE — Patient Instructions (Signed)

## 2020-08-01 NOTE — Progress Notes (Signed)
60 y.o. [id number] Single Caucasian female here for annual exam.    Really wants cervical cancer screening.   Mirena removed about age 37 yo.   Night sweats manageable.   Uses macrodantin for UTI prophylaxis.   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   Received her Covid booster.  PCP:  Mayra Neer, MD   Patient's last menstrual period was 09/11/2015 (lmp unknown).           Sexually active: Yes.    The current method of family planning is post menopausal status.    Exercising: Yes.    weights Smoker:  no  Health Maintenance: Pap: 01-26-20 Neg:Neg HR HPV, 06-21-19 ASCUS:Pos HR HPV;Neg 16/18/45, 12-15-18 Neg:Pos HR HPV History of abnormal Pap:  Yes, 07-20-19 colpo showed mild atypia consistent with HPV changes and cannot rule out LGSIL.   Hx cervical conization 2012, [place] 2006 MMG: Had this AM--07/20/19 Neg/BiRads1 Colonoscopy: 2021 normal;next 10 years BMD:  n/a  Result  n/a TDaP: 01-07-17 Gardasil:   no HIV: during preg Hep C: unsure Screening Labs:  PCP.    reports that she has never smoked. She has never used smokeless tobacco. She reports that she does not drink alcohol and does not use drugs.  Past Medical History:  Diagnosis Date  . ASCUS with positive high risk HPV cervical 05/2018   Colposcopy ECC with low-grade atypia.  Follow-up Pap smear 11/2018 showed normal cytology with positive high-risk HPV screen  . Migraine    with menses    Past Surgical History:  Procedure Laterality Date  . CARPAL TUNNEL RELEASE     Bilateral  . CERVICAL CONE BIOPSY  2006  . INTRAUTERINE DEVICE INSERTION  06/29/2013   Mirena has been removed.  Marland Kitchen KNEE SURGERY     right, knee scope X3  . OOPHORECTOMY     Laparotomy with right ovarian cystectomy    Current Outpatient Medications  Medication Sig Dispense Refill  . Ascorbic Acid (VITAMIN C) 1000 MG tablet Take 1,000 mg by mouth daily.    . B Complex Vitamins (VITAMIN B COMPLEX PO) Take 1 tablet by mouth daily.    Marland Kitchen BIOTIN PO Take 1  tablet by mouth daily.    . COLLAGEN PO Take by mouth.    Marland Kitchen FLUoxetine (PROZAC) 20 MG capsule Take 20 mg by mouth daily.    . Multiple Vitamin (MULTIVITAMIN) tablet Take 1 tablet by mouth daily.    . SUMAtriptan (IMITREX) 100 MG tablet TAKE 1 TAB EVERY 2 HOURS AS NEEDED FOR MIGRAINE.REPEAT IN 2 HOURS IF HEADACHE PERSISTS OR RECURS. 9 tablet 1  . tretinoin (RETIN-A) 0.1 % cream Apply topically at bedtime. 45 g 8  . Turmeric (QC TUMERIC COMPLEX PO) Take 1 tablet by mouth daily.    Marland Kitchen VITAMIN D PO Take by mouth.    . vitamin E 180 MG (400 UNITS) capsule Take 400 Units by mouth daily.    . nitrofurantoin (MACRODANTIN) 50 MG capsule Use as needed after intercourse 30 capsule 1   No current facility-administered medications for this visit.    Family History  Problem Relation Age of Onset  . Breast cancer Mother        precancerous cells in breast--40's  . Breast cancer Maternal Aunt        40's  . Breast cancer Maternal Grandmother        age unknown  . Breast cancer Maternal Aunt        PRE-CANCEROUS CELLS--40's  . Breast  cancer Maternal Aunt        40's  . Breast cancer Maternal Aunt        40's  . Hypertension Father   . Breast cancer Cousin        Mat side- 40's    Review of Systems  All other systems reviewed and are negative.   Exam:   BP 120/64   Pulse 71   Ht 5\' 1"  (1.549 m)   Wt 140 lb (63.5 kg)   LMP 09/11/2015 (LMP Unknown)   SpO2 99%   BMI 26.45 kg/m     General appearance: alert, cooperative and appears stated age Head: normocephalic, without obvious abnormality, atraumatic Neck: no adenopathy, supple, symmetrical, trachea midline and thyroid normal to inspection and palpation Lungs: clear to auscultation bilaterally Breasts: normal appearance, no masses or tenderness, No nipple retraction or dimpling, No nipple discharge or bleeding, No axillary adenopathy Heart: regular rate and rhythm Abdomen: soft, non-tender; no masses, no organomegaly Extremities:  extremities normal, atraumatic, no cyanosis or edema Skin: skin color, texture, turgor normal. No rashes or lesions Lymph nodes: cervical, supraclavicular, and axillary nodes normal. Neurologic: grossly normal  Pelvic: External genitalia:  no lesions              No abnormal inguinal nodes palpated.              Urethra:  normal appearing urethra with no masses, tenderness or lesions              Bartholins and Skenes: normal                 Vagina: normal appearing vagina with normal color and discharge, no lesions              Cervix: no lesions              Pap taken: Yes.   Bimanual Exam:  Uterus:  normal size, contour, position, consistency, mobility, non-tender              Adnexa: no mass, fullness, tenderness              Rectal exam: Yes.  .  Confirms.              Anus:  normal sphincter tone, no lesions  Chaperone was present for exam.  Assessment:   Well woman visit with normal exam. Hx LGSIL 2021.   Status post prior conization and LEEP. FH breast cancer.  Anxiety and depression.  Hx menstrual migraines.  Hx laparotomy with right ovarian cystectomy. FH breast cancer. Post coital UTI.  Plan: Mammogram screening discussed. Self breast awareness reviewed. Pap and HR HPV as above. Guidelines for Calcium, Vitamin D, regular exercise program including cardiovascular and weight bearing exercise. Rx for Macrodantin 50 mg prn. Referral for genetic counseling and testing.  Follow up annually and prn.

## 2020-08-02 LAB — CYTOLOGY - PAP
Comment: NEGATIVE
Diagnosis: NEGATIVE
High risk HPV: NEGATIVE

## 2020-08-05 ENCOUNTER — Ambulatory Visit: Payer: PRIVATE HEALTH INSURANCE | Admitting: Dermatology

## 2020-08-07 NOTE — Telephone Encounter (Signed)
Patient scheduled on 08/15/20

## 2020-08-14 ENCOUNTER — Telehealth: Payer: Self-pay | Admitting: Genetic Counselor

## 2020-08-14 NOTE — Telephone Encounter (Signed)
Melanie Valenzuela cld to reschedule her genetic counseling appt to 5/23 at 9am w/[facility]

## 2020-08-15 ENCOUNTER — Inpatient Hospital Stay: Payer: PRIVATE HEALTH INSURANCE | Admitting: Genetic Counselor

## 2020-08-15 ENCOUNTER — Inpatient Hospital Stay: Payer: PRIVATE HEALTH INSURANCE

## 2020-09-12 ENCOUNTER — Telehealth: Payer: Self-pay | Admitting: Genetic Counselor

## 2020-09-12 ENCOUNTER — Inpatient Hospital Stay: Payer: PRIVATE HEALTH INSURANCE

## 2020-09-12 ENCOUNTER — Inpatient Hospital Stay: Payer: PRIVATE HEALTH INSURANCE | Admitting: Genetic Counselor

## 2020-09-12 NOTE — Telephone Encounter (Signed)
Melanie Valenzuela called to discuss OOP costs of genetic testing and appt.  CPT code for genetic counseling appt provided.  Pt wishes to gather more information about family history prior to genetic counseling appt.  Pt said she will call to reschedule appt when ready.

## 2020-09-12 NOTE — Telephone Encounter (Addendum)
Melanie Valenzuela informed [name] that had exposure to [place].  LVM requesting call back from patient to discuss virtual visit or rescheduling in-person appointment.

## 2020-12-17 ENCOUNTER — Encounter: Payer: Self-pay | Admitting: Family Medicine

## 2020-12-17 ENCOUNTER — Ambulatory Visit: Payer: PRIVATE HEALTH INSURANCE | Admitting: Family Medicine

## 2020-12-17 VITALS — BP 110/80 | HR 70 | Ht 62.0 in | Wt 130.0 lb

## 2020-12-17 DIAGNOSIS — Z789 Other specified health status: Secondary | ICD-10-CM

## 2020-12-17 NOTE — Progress Notes (Signed)
Established Patient Office Visit  Subjective:  Patient ID: Melanie Valenzuela, female    DOB: [birth year]  Age: 60 y.o. MRN: [id number]  CC: No chief complaint on file.   HPI Melanie Valenzuela presents for her required wellness visit for insurance. Her PCP is Dr. Brigitte Pulse who she sees regularly. Her colonoscopy, mammogram, and pap smear are all utd. She states she works out every morning and eats very healthy. She feels well and has no concerns today.     Past Medical History:  Diagnosis Date   ASCUS with positive high risk HPV cervical 05/2018   Colposcopy ECC with low-grade atypia.  Follow-up Pap smear 11/2018 showed normal cytology with positive high-risk HPV screen   Migraine    with menses   Past Surgical History:  Procedure Laterality Date   CARPAL TUNNEL RELEASE     Bilateral   CERVICAL CONE BIOPSY  2006   INTRAUTERINE DEVICE INSERTION  06/29/2013   Mirena has been removed.   KNEE SURGERY     right, knee scope X3   OOPHORECTOMY     Laparotomy with right ovarian cystectomy    Family History  Problem Relation Age of Onset   Breast cancer Mother        precancerous cells in breast--40's   Breast cancer Maternal Aunt        40's   Breast cancer Maternal Grandmother        age unknown   Breast cancer Maternal Aunt        PRE-CANCEROUS CELLS--40's   Breast cancer Maternal Aunt        40's   Breast cancer Maternal Aunt        40's   Hypertension Father    Breast cancer Cousin        Mat side- 50's    Social History   Socioeconomic History   Marital status: Single    Spouse name: Not on file   Number of children: Not on file   Years of education: Not on file   Highest education level: Not on file  Occupational History   Not on file  Tobacco Use   Smoking status: Never   Smokeless tobacco: Never  Vaping Use   Vaping Use: Never used  Substance and Sexual Activity   Alcohol use: No    Alcohol/week: 0.0 standard drinks   Drug use: No   Sexual activity:  Yes    Partners: Male    Birth control/protection: Post-menopausal, None    Comment: 1st intercourse 60 yo-Fewer than 5 partners  Other Topics Concern   Not on file  Social History Narrative   Not on file   Social Determinants of Health   Financial Resource Strain: Not on file  Food Insecurity: Not on file  Transportation Needs: Not on file  Physical Activity: Not on file  Stress: Not on file  Social Connections: Not on file  Intimate Partner Violence: Not on file    Outpatient Medications Prior to Visit  Medication Sig Dispense Refill   FLUoxetine (PROZAC) 20 MG capsule Take 20 mg by mouth daily.     nitrofurantoin (MACRODANTIN) 50 MG capsule Use as needed after intercourse 30 capsule 1   SUMAtriptan (IMITREX) 100 MG tablet TAKE 1 TAB EVERY 2 HOURS AS NEEDED FOR MIGRAINE.REPEAT IN 2 HOURS IF HEADACHE PERSISTS OR RECURS. 9 tablet 1   tretinoin (RETIN-A) 0.1 % cream Apply topically at bedtime. 45 g 8   Ascorbic Acid (VITAMIN C) 1000  MG tablet Take 1,000 mg by mouth daily. (Patient not taking: Reported on 12/17/2020)     B Complex Vitamins (VITAMIN B COMPLEX PO) Take 1 tablet by mouth daily. (Patient not taking: Reported on 12/17/2020)     BIOTIN PO Take 1 tablet by mouth daily. (Patient not taking: Reported on 12/17/2020)     COLLAGEN PO Take by mouth. (Patient not taking: Reported on 12/17/2020)     Multiple Vitamin (MULTIVITAMIN) tablet Take 1 tablet by mouth daily. (Patient not taking: Reported on 12/17/2020)     Turmeric (QC TUMERIC COMPLEX PO) Take 1 tablet by mouth daily. (Patient not taking: Reported on 12/17/2020)     VITAMIN D PO Take by mouth. (Patient not taking: Reported on 12/17/2020)     vitamin E 180 MG (400 UNITS) capsule Take 400 Units by mouth daily. (Patient not taking: Reported on 12/17/2020)     No facility-administered medications prior to visit.    No Known Allergies  ROS Review of Systems  Constitutional:  Negative for chills, fatigue, fever and unexpected  weight change.  HENT:  Negative for congestion, ear pain, sinus pressure, sinus pain and sore throat.   Eyes:  Negative for discharge and visual disturbance.  Respiratory:  Negative for cough, shortness of breath and wheezing.   Cardiovascular:  Negative for chest pain and leg swelling.  Gastrointestinal:  Negative for abdominal pain, blood in stool, constipation, diarrhea, nausea and vomiting.  Genitourinary:  Negative for difficulty urinating and hematuria.  Skin:  Negative for color change.  Neurological:  Negative for dizziness, weakness, light-headedness and headaches.  Hematological:  Negative for adenopathy.  All other systems reviewed and are negative.    Objective:    Physical Exam Vitals reviewed.  Constitutional:      General: She is not in acute distress.    Appearance: Normal appearance. She is not toxic-appearing.  HENT:     Head: Normocephalic and atraumatic.  Eyes:     General:        Right eye: No discharge.        Left eye: No discharge.  Cardiovascular:     Rate and Rhythm: Normal rate and regular rhythm.     Heart sounds: Normal heart sounds.  Pulmonary:     Effort: Pulmonary effort is normal. No respiratory distress.     Breath sounds: Normal breath sounds.  Skin:    General: Skin is warm and dry.  Neurological:     Mental Status: She is alert and oriented to person, place, and time.  Psychiatric:        Mood and Affect: Mood normal.        Behavior: Behavior normal.    LMP 09/11/2015 (LMP Unknown)  Wt Readings from Last 3 Encounters:  08/01/20 140 lb (63.5 kg)  06/21/19 140 lb (63.5 kg)  04/27/19 137 lb 6.4 oz (62.3 kg)     Today's Vitals   12/17/20 1304  BP: 110/80  Pulse: 70  SpO2: 98%  Weight: 130 lb (59 kg)  Height: '5\' 2"'$  (1.575 m)   Body mass index is 23.78 kg/m.     Assessment & Plan:   Participant in health and wellness plan  Keep all scheduled appts with PCP. Continue efforts at healthy eating and exercise. F/u here prn.    Follow-up: No follow-ups on file.    Cheyenne Adas, NP

## 2021-01-24 ENCOUNTER — Other Ambulatory Visit: Payer: Self-pay

## 2021-01-24 ENCOUNTER — Emergency Department (HOSPITAL_BASED_OUTPATIENT_CLINIC_OR_DEPARTMENT_OTHER)
Admission: EM | Admit: 2021-01-24 | Discharge: 2021-01-24 | Disposition: A | Discharge status: Home / Self Care | Payer: No Typology Code available for payment source | Attending: Emergency Medicine | Admitting: Emergency Medicine

## 2021-01-24 ENCOUNTER — Encounter (HOSPITAL_BASED_OUTPATIENT_CLINIC_OR_DEPARTMENT_OTHER): Payer: Self-pay | Admitting: *Deleted

## 2021-01-24 DIAGNOSIS — H9191 Unspecified hearing loss, right ear: Secondary | ICD-10-CM | POA: Insufficient documentation

## 2021-01-24 DIAGNOSIS — R42 Dizziness and giddiness: Secondary | ICD-10-CM | POA: Insufficient documentation

## 2021-01-24 MED ORDER — ONDANSETRON HCL 4 MG/2ML IJ SOLN
4.0000 mg | Freq: Once | INTRAMUSCULAR | Status: AC | PRN
  Administered 2021-01-24: 4 mg via INTRAVENOUS
  Filled 2021-01-24: qty 2

## 2021-01-24 MED ORDER — MECLIZINE HCL 25 MG PO TABS: 25.0000 mg | ORAL_TABLET | Freq: Three times a day (TID) | ORAL | 0 refills | Status: DC | PRN

## 2021-01-24 MED ORDER — ONDANSETRON 4 MG PO TBDP: 4.0000 mg | ORAL_TABLET | Freq: Three times a day (TID) | ORAL | 0 refills | Status: DC | PRN

## 2021-01-24 MED ORDER — MECLIZINE HCL 25 MG PO TABS
50.0000 mg | ORAL_TABLET | Freq: Once | ORAL | Status: AC
  Administered 2021-01-24: 50 mg via ORAL
  Filled 2021-01-24: qty 2

## 2021-01-24 NOTE — ED Triage Notes (Signed)
C/o dizziness x 1 week after punctured ear drum, vomiting x today

## 2021-01-24 NOTE — Discharge Instructions (Signed)
You were seen and evaluated in the emergency department today for further evaluation of dizziness.  As we discussed, your neurological exam was reassuring for any brain or brainstem abnormality.  Given that you have had similar symptoms over the last week with the history of your right perforated eardrum this is likely the cause.  I have given you Zofran for nausea and vomiting in the future.  Please take meclizine as needed when you experience dizziness.  Please turn to the emergency department if you experience facial droop, slurred speech, trouble walking, severe and constant dizziness, weakness/numbness of your upper and lower extremity, or any other concerns you might have.

## 2021-01-24 NOTE — ED Provider Notes (Signed)
[facility] EMERGENCY DEPARTMENT Provider Note   CSN: [id number] Arrival date & time: 01/24/21  1432     History Chief Complaint  Patient presents with   Dizziness    Melanie Valenzuela is a 60 y.o. female who presents the emergency department for constant dizziness over the last 5 days. She states her symptoms initially started after rupturing her right eardrum 1 week ago.  Since then she has had dizziness characterized as a spinning room and unsteady sensation which was improved with meclizine.  Today however, her dizziness was severe in severity and was not improved with meclizine.  She has had multiple episodes of vomiting.  She denies any loss of consciousness, trouble talking, trouble breathing, chest pain, weakness/numbness in upper or lower extremity, facial droop, fever, chills.  She does state that she has decreased hearing in the right ear secondary to the ruptured eardrum.  The history is provided by the patient. No language interpreter was used.  Dizziness     Past Medical History:  Diagnosis Date   ASCUS with positive high risk HPV cervical 05/2018   Colposcopy ECC with low-grade atypia.  Follow-up Pap smear 11/2018 showed normal cytology with positive high-risk HPV screen   Migraine    with menses    Patient Active Problem List   Diagnosis Date Noted   BV (bacterial vaginosis) 09/08/2016   HPV (human papilloma virus) infection    Migraine    HGSIL (high grade squamous intraepithelial dysplasia)     Past Surgical History:  Procedure Laterality Date   CARPAL TUNNEL RELEASE     Bilateral   CERVICAL CONE BIOPSY  2006   INTRAUTERINE DEVICE INSERTION  06/29/2013   Mirena has been removed.   KNEE SURGERY     right, knee scope X3   OOPHORECTOMY     Laparotomy with right ovarian cystectomy     OB History     Gravida  2   Para  2   Term  2   Preterm      AB  0   Living  2      SAB      IAB      Ectopic  0   Multiple      Live  Births              Family History  Problem Relation Age of Onset   Breast cancer Mother        precancerous cells in breast--40's   Breast cancer Maternal Aunt        31's   Breast cancer Maternal Grandmother        age unknown   Breast cancer Maternal Aunt        PRE-CANCEROUS CELLS--40's   Breast cancer Maternal Aunt        40's   Breast cancer Maternal Aunt        40's   Hypertension Father    Breast cancer Cousin        Mat side- 77's    Social History   Tobacco Use   Smoking status: Never   Smokeless tobacco: Never  Vaping Use   Vaping Use: Never used  Substance Use Topics   Alcohol use: No    Alcohol/week: 0.0 standard drinks   Drug use: No    Home Medications Prior to Admission medications   Medication Sig Start Date End Date Taking? Authorizing Provider  ondansetron (ZOFRAN ODT) 4 MG disintegrating tablet Take 1 tablet (4  mg total) by mouth every 8 (eight) hours as needed for nausea or vomiting. 01/24/21  Yes Raul Del, Amenah Tucci M, PA-C  Ascorbic Acid (VITAMIN C) 1000 MG tablet Take 1,000 mg by mouth daily. Patient not taking: Reported on 12/17/2020    [provider]  B Complex Vitamins (VITAMIN B COMPLEX PO) Take 1 tablet by mouth daily. Patient not taking: Reported on 12/17/2020    [provider]  BIOTIN PO Take 1 tablet by mouth daily. Patient not taking: Reported on 12/17/2020    [provider]  COLLAGEN PO Take by mouth. Patient not taking: Reported on 12/17/2020    [provider]  FLUoxetine (PROZAC) 20 MG capsule Take 20 mg by mouth daily. 02/08/19   [provider]  Multiple Vitamin (MULTIVITAMIN) tablet Take 1 tablet by mouth daily. Patient not taking: Reported on 12/17/2020    [provider]  nitrofurantoin (MACRODANTIN) 50 MG capsule Use as needed after intercourse 08/01/20   Yisroel Ramming, Everardo All, MD  SUMAtriptan (IMITREX) 100 MG tablet TAKE 1 TAB EVERY 2 HOURS AS NEEDED FOR MIGRAINE.REPEAT  IN 2 HOURS IF HEADACHE PERSISTS OR RECURS. 01/03/18   Fontaine, Belinda Block, MD  tretinoin (RETIN-A) 0.1 % cream Apply topically at bedtime. 02/08/20 02/07/21  Lavonna Monarch, MD  Turmeric (QC TUMERIC COMPLEX PO) Take 1 tablet by mouth daily. Patient not taking: Reported on 12/17/2020    [provider]  VITAMIN D PO Take by mouth. Patient not taking: Reported on 12/17/2020    [provider]  vitamin E 180 MG (400 UNITS) capsule Take 400 Units by mouth daily. Patient not taking: Reported on 12/17/2020    [provider]    Allergies    Patient has no known allergies.  Review of Systems   Review of Systems  Neurological:  Positive for dizziness.  All other systems reviewed and are negative.  Physical Exam Updated Vital Signs BP 116/81   Pulse 74   Temp 98.1 F (36.7 C)   Resp 18   Ht 5\' 1"  (1.549 m)   Wt 59 kg   LMP 09/11/2015 (LMP Unknown)   SpO2 97%   BMI 24.56 kg/m   Physical Exam Constitutional:      General: She is not in acute distress.    Appearance: Normal appearance.  HENT:     Head: Normocephalic and atraumatic.     Ears:     Comments: Left external auditory canal and TM are normal.  Right external auditory canal showed dried blood.  There was a piece of paper over the right TM which correlates with her history after being evaluated ENT. Eyes:     General:        Right eye: No discharge.        Left eye: No discharge.     Extraocular Movements: Extraocular movements intact.     Right eye: No nystagmus.     Pupils: Pupils are equal, round, and reactive to light.  Cardiovascular:     Comments: Regular rate and rhythm.  S1/S2 are distinct without any evidence of murmur, rubs, or gallops.  Radial pulses are 2+ bilaterally.  Dorsalis pedis pulses are 2+ bilaterally.  No evidence of pedal edema. Pulmonary:     Comments: Clear to auscultation bilaterally.  Normal effort.  No respiratory distress.  No evidence of wheezes, rales, or rhonchi  heard throughout. Abdominal:     General: Abdomen is flat. Bowel sounds are normal. There is no distension.  Tenderness: There is no abdominal tenderness. There is no guarding or rebound.  Musculoskeletal:        General: Normal range of motion.     Cervical back: Neck supple.  Skin:    General: Skin is warm and dry.     Findings: No rash.  Neurological:     General: No focal deficit present.     Mental Status: She is alert.     Comments: Cranial nerves II through XII are intact.  5/5 strength in the upper and lower extremities.  Normal sensation in the upper lower extremities.  Normal finger-to-nose.  No dysdiadochokinesia on rapid alternating movements.  Normal heel-to-shin.  Psychiatric:        Mood and Affect: Mood normal.        Behavior: Behavior normal.    ED Results / Procedures / Treatments   Labs (all labs ordered are listed, but only abnormal results are displayed) Labs Reviewed - No data to display  EKG None  Radiology No results found.  Procedures Procedures   Medications Ordered in ED Medications  ondansetron (ZOFRAN) injection 4 mg (4 mg Intravenous Given 01/24/21 1515)  meclizine (ANTIVERT) tablet 50 mg (50 mg Oral Given 01/24/21 1640)    ED Course  I have reviewed the triage vital signs and the nursing notes.  Pertinent labs & imaging results that were available during my care of the patient were reviewed by me and considered in my medical decision making (see chart for details).  Clinical Course as of 01/24/21 1800  Fri Jan 24, 2021  1640 Patient dizziness came on when she was looking to the right. [CF]  1752 I ambulated the patient myself and she had a normal gait. She was able to walk on her toes and heels with out difficulty. No evidence of ataxia.  [CF]  8101 I discussed this case with my attending physician who cosigned this note including patient's presenting symptoms, physical exam, and planned diagnostics and interventions. Attending  physician stated agreement with plan or made changes to plan which were implemented.    [CF]    Clinical Course User Index [CF] Cherrie Gauze   MDM Rules/Calculators/A&P                          Tonyia Marschall Seeney is a 60 y.o. female who presents to the emergency department for further evaluation of dizziness.  History and physical exam is less concerning for a central cause of her vertigo at this time.  She had a totally normal neurological exam and I do not feel imaging is warranted at this time given the history of her right TM membrane rupture and prior dizziness episodes.  Her dizziness and vomiting was significantly improved with Zofran and meclizine given the department.  I personally ambulated the patient who had a normal gait.  Strict return precautions were given.  Patient and significant other demonstrated full understanding and teach back.  She is hemodynamically stable and safe for discharge.  Final Clinical Impression(s) / ED Diagnoses Final diagnoses:  Dizziness    Rx / DC Orders ED Discharge Orders          Ordered    ondansetron (ZOFRAN ODT) 4 MG disintegrating tablet  Every 8 hours PRN        01/24/21 1753             Hendricks Limes, PA-C 01/24/21 1800    Truddie Hidden,  MD 01/25/21 0945

## 2021-05-21 ENCOUNTER — Other Ambulatory Visit: Payer: Self-pay | Admitting: Family Medicine

## 2021-05-21 DIAGNOSIS — Z1231 Encounter for screening mammogram for malignant neoplasm of breast: Secondary | ICD-10-CM

## 2021-07-31 NOTE — Progress Notes (Signed)
61 y.o. G17P2002 Single Caucasian female here for annual exam.   ? ?Tried to stop Prozac and then restarted it back.  ?Doing OK. ? ?Wants pap and HR HPV testing.  ?She understands insurance may not pay.  ? ?Wants refill of Nitrofurantoin for UTI prevention with sex.  ? ?PCP:   Dr. Mayra Neer. ? ?Patient's last menstrual period was 09/11/2015 (lmp unknown).     ?  ?    ?Sexually active: Yes.    ?The current method of family planning is post menopausal status.    ?Exercising: Yes.    Home exercise.  ?Smoker:  no ? ?Health Maintenance: ?Pap:  01-26-20 normal Neg HR HPV--08-01-20 Normal Neg HPV ?History of abnormal Pap:  yes 12-15-18 normal Pos HR HPV-06-24-18-Colpo LGSIL ?MMG: 08-05-21 normal BI Rads 1 Cat.B ?Colonoscopy:  2021 normal 10 yr follow up ?BMD:   never  Result   ?TDaP:  2018 ?Gardasil:   no ?HIV:NR ?Hep C: ?Screening Labs:  none. ? ? reports that she has never smoked. She has never used smokeless tobacco. She reports that she does not drink alcohol and does not use drugs. ? ?Past Medical History:  ?Diagnosis Date  ? ASCUS with positive high risk HPV cervical 05/2018  ? Colposcopy ECC with low-grade atypia.  Follow-up Pap smear 11/2018 showed normal cytology with positive high-risk HPV screen  ? Migraine   ? with menses  ? ? ?Past Surgical History:  ?Procedure Laterality Date  ? CARPAL TUNNEL RELEASE    ? Bilateral  ? CERVICAL CONE BIOPSY  2006  ? INTRAUTERINE DEVICE INSERTION  06/29/2013  ? Mirena has been removed.  ? KNEE SURGERY    ? right, knee scope X3  ? OOPHORECTOMY    ? Laparotomy with right ovarian cystectomy  ? ? ?Current Outpatient Medications  ?Medication Sig Dispense Refill  ? FLUoxetine (PROZAC) 20 MG capsule Take 20 mg by mouth daily.    ? nitrofurantoin (MACRODANTIN) 50 MG capsule Use as needed after intercourse (Patient not taking: Reported on 08/08/2021) 30 capsule 1  ? SUMAtriptan (IMITREX) 100 MG tablet TAKE 1 TAB EVERY 2 HOURS AS NEEDED FOR MIGRAINE.REPEAT IN 2 HOURS IF HEADACHE PERSISTS OR  RECURS. (Patient not taking: Reported on 08/08/2021) 9 tablet 1  ? VITAMIN D PO Take by mouth. (Patient not taking: Reported on 12/17/2020)    ? ?No current facility-administered medications for this visit.  ? ? ?Family History  ?Problem Relation Age of Onset  ? Breast cancer Mother   ?     precancerous cells in breast--40's  ? Breast cancer Maternal Aunt   ?     40's  ? Breast cancer Maternal Grandmother   ?     age unknown  ? Breast cancer Maternal Aunt   ?     PRE-CANCEROUS CELLS--40's  ? Breast cancer Maternal Aunt   ?     40's  ? Breast cancer Maternal Aunt   ?     40's  ? Hypertension Father   ? Breast cancer Cousin   ?     Mat side- 40's  ? ? ?Review of Systems  ?All other systems reviewed and are negative. ? ?Exam:   ?BP 120/78 (BP Location: Right Arm, Patient Position: Sitting, Cuff Size: Normal)   Pulse 70   Ht '5\' 1"'$  (1.549 m)   Wt 148 lb (67.1 kg)   LMP 09/11/2015 (LMP Unknown)   SpO2 98%   BMI 27.96 kg/m?     ?General appearance:  alert, cooperative and appears stated age ?Head: normocephalic, without obvious abnormality, atraumatic ?Neck: no adenopathy, supple, symmetrical, trachea midline and thyroid normal to inspection and palpation ?Lungs: clear to auscultation bilaterally ?Breasts: normal appearance, no masses or tenderness, No nipple retraction or dimpling, No nipple discharge or bleeding, No axillary adenopathy ?Heart: regular rate and rhythm ?Abdomen: soft, non-tender; no masses, no organomegaly ?Extremities: extremities normal, atraumatic, no cyanosis or edema ?Skin: skin color, texture, turgor normal. No rashes or lesions ?Lymph nodes: cervical, supraclavicular, and axillary nodes normal. ?Neurologic: grossly normal ? ?Pelvic: External genitalia:  no lesions ?             No abnormal inguinal nodes palpated. ?             Urethra:  normal appearing urethra with no masses, tenderness or lesions ?             Bartholins and Skenes: normal    ?             Vagina: normal appearing vagina with  normal color and discharge, no lesions ?             Cervix: no lesions ?             Pap taken: yes ?Bimanual Exam:  Uterus:  normal size, contour, position, consistency, mobility, non-tender ?             Adnexa: no mass, fullness, tenderness ?             Rectal exam: yes.  Confirms. ?             Anus:  normal sphincter tone, no lesions ? ?Chaperone was present for exam:  Kimalexis, CMA ? ?Assessment:   ?Well woman visit with gynecologic exam. ?Hx LGSIL 2021.   Status post prior conization and LEEP. ?Strong FH breast cancer.  ?Anxiety and depression.  ?Hx laparotomy with right ovarian cystectomy. ?Post coital UTI. ? ?Plan: ?Mammogram screening discussed. ?Self breast awareness reviewed. ?Pap and HR HPV collected ?Guidelines for Calcium, Vitamin D, regular exercise program including cardiovascular and weight bearing exercise. ?Referral for genetic counseling and possible testing.  ?Rx for Nitrofurantoin for post coital UTI prophylaxis.  ?Follow up annually and prn.  ? ?After visit summary provided.  ? ? ?  ?

## 2021-08-05 ENCOUNTER — Ambulatory Visit
Admission: RE | Admit: 2021-08-05 | Discharge: 2021-08-05 | Disposition: A | Discharge status: Home / Self Care | Payer: PRIVATE HEALTH INSURANCE | Source: Ambulatory Visit | Attending: Family Medicine | Admitting: Family Medicine

## 2021-08-05 DIAGNOSIS — Z1231 Encounter for screening mammogram for malignant neoplasm of breast: Secondary | ICD-10-CM

## 2021-08-08 ENCOUNTER — Encounter: Payer: Self-pay | Admitting: Obstetrics and Gynecology

## 2021-08-08 ENCOUNTER — Ambulatory Visit (INDEPENDENT_AMBULATORY_CARE_PROVIDER_SITE_OTHER): Payer: PRIVATE HEALTH INSURANCE | Admitting: Obstetrics and Gynecology

## 2021-08-08 ENCOUNTER — Other Ambulatory Visit (HOSPITAL_COMMUNITY)
Admission: RE | Admit: 2021-08-08 | Discharge: 2021-08-08 | Disposition: A | Discharge status: Home / Self Care | Payer: PRIVATE HEALTH INSURANCE | Source: Ambulatory Visit | Attending: Obstetrics and Gynecology | Admitting: Obstetrics and Gynecology

## 2021-08-08 VITALS — BP 120/78 | HR 70 | Ht 61.0 in | Wt 148.0 lb

## 2021-08-08 DIAGNOSIS — Z01419 Encounter for gynecological examination (general) (routine) without abnormal findings: Secondary | ICD-10-CM

## 2021-08-08 DIAGNOSIS — Z124 Encounter for screening for malignant neoplasm of cervix: Secondary | ICD-10-CM | POA: Diagnosis present

## 2021-08-08 DIAGNOSIS — Z87410 Personal history of cervical dysplasia: Secondary | ICD-10-CM | POA: Insufficient documentation

## 2021-08-08 DIAGNOSIS — Z803 Family history of malignant neoplasm of breast: Secondary | ICD-10-CM

## 2021-08-08 DIAGNOSIS — Z87440 Personal history of urinary (tract) infections: Secondary | ICD-10-CM | POA: Diagnosis not present

## 2021-08-08 MED ORDER — NITROFURANTOIN MACROCRYSTAL 50 MG PO CAPS: ORAL_CAPSULE | ORAL | 1 refills | Status: DC

## 2021-08-08 NOTE — Patient Instructions (Signed)

## 2021-08-13 LAB — CYTOLOGY - PAP
Comment: NEGATIVE
Diagnosis: NEGATIVE
High risk HPV: NEGATIVE

## 2021-10-28 ENCOUNTER — Encounter: Payer: Self-pay | Admitting: Nurse Practitioner

## 2021-10-28 ENCOUNTER — Ambulatory Visit: Payer: PRIVATE HEALTH INSURANCE | Admitting: Nurse Practitioner

## 2021-10-28 VITALS — BP 102/70 | HR 67 | Temp 97.7°F

## 2021-10-28 DIAGNOSIS — Z789 Other specified health status: Secondary | ICD-10-CM

## 2021-10-28 NOTE — Progress Notes (Signed)
Office Visit  Subjective:  Patient ID: Melanie Valenzuela, female    DOB: 10/15/1960  Age: 61 y.o. MRN: [id number]  CC: wellness exam  HPI Melanie Valenzuela presents today for wellness exam visit for insurance benefit.  Patient has a PCP: Dr. Brigitte Pulse  PMH significant for: ASCUS- follows OBGYN Last labs per PCP were completed: 2021  Health Maintenance:  Colonoscopy: up to date, due at age 54 PAP/Mammogram: Last completed April 2023    Smoker: Never.  Does not drink Alcohol.   Immunizations:  Shingrix- never done, interested in completing series. COVID- up to date   Lifestyle: Exercise- daily x 1 hour Diet: eats healthy.    Past Medical History:  Diagnosis Date   ASCUS with positive high risk HPV cervical 05/2018   Colposcopy ECC with low-grade atypia.  Follow-up Pap smear 11/2018 showed normal cytology with positive high-risk HPV screen   Migraine    with menses    Past Surgical History:  Procedure Laterality Date   CARPAL TUNNEL RELEASE     Bilateral   CERVICAL CONE BIOPSY  2006   INTRAUTERINE DEVICE INSERTION  06/29/2013   Mirena has been removed.   KNEE SURGERY     right, knee scope X3   OOPHORECTOMY     Laparotomy with right ovarian cystectomy    Outpatient Medications Prior to Visit  Medication Sig Dispense Refill   FLUoxetine (PROZAC) 20 MG capsule Take 20 mg by mouth daily.     nitrofurantoin (MACRODANTIN) 50 MG capsule Take one capsule (50 mg) by mouth as needed after intercourse 30 capsule 1   SUMAtriptan (IMITREX) 100 MG tablet TAKE 1 TAB EVERY 2 HOURS AS NEEDED FOR MIGRAINE.REPEAT IN 2 HOURS IF HEADACHE PERSISTS OR RECURS. (Patient not taking: Reported on 08/08/2021) 9 tablet 1   VITAMIN D PO Take by mouth. (Patient not taking: Reported on 12/17/2020)     No facility-administered medications prior to visit.    ROS Review of Systems  Constitutional:  Negative for fatigue.  Respiratory:  Negative for shortness of breath.   Cardiovascular:  Negative for  chest pain and leg swelling.  Gastrointestinal:  Negative for constipation and diarrhea.  Neurological:  Negative for headaches.    Objective:  BP 102/70   Pulse 67   Temp 97.7 F (36.5 C)   LMP 09/11/2015 (LMP Unknown)   SpO2 98%   Physical Exam Constitutional:      General: She is not in acute distress. HENT:     Head: Normocephalic.  Cardiovascular:     Rate and Rhythm: Normal rate and regular rhythm.     Pulses: Normal pulses.     Heart sounds: Normal heart sounds.  Pulmonary:     Effort: Pulmonary effort is normal.     Breath sounds: Normal breath sounds.  Abdominal:     General: Bowel sounds are normal.     Palpations: Abdomen is soft.  Musculoskeletal:        General: Normal range of motion.  Skin:    General: Skin is warm.  Neurological:     General: No focal deficit present.     Mental Status: She is alert and oriented to person, place, and time.  Psychiatric:        Mood and Affect: Mood normal.      Assessment & Plan:    Melanie Valenzuela was seen today for wellness exam.  Diagnoses and all orders for this visit:  Participant in health and wellness plan   Adult  wellness physical was conducted today. Importance of diet and exercise were discussed.  We reviewed immunizations and gave recommendations regarding current immunization needed for age. Patient needs Shingrix vaccine. This will be administered once vaccine is obtained.  Preventative health exams are up to date.   Patient was advised yearly wellness exam  No orders of the defined types were placed in this encounter.   No orders of the defined types were placed in this encounter.   Follow-up: As needed

## 2021-11-04 ENCOUNTER — Ambulatory Visit: Payer: PRIVATE HEALTH INSURANCE | Admitting: Nurse Practitioner

## 2021-11-04 DIAGNOSIS — Z23 Encounter for immunization: Secondary | ICD-10-CM

## 2021-11-04 NOTE — Progress Notes (Signed)
Shingrix vaccine 0.24m adminsitered on Left Deltoid. Well tolerated. Lot Number: [id number]: 03/13/22  1st Dose of Shingrix given today. CDC VIS declined today. Tolerated well. No immediate adverse reactions noted.  Take OTC tylenol as needed for expected side effects such as muscle aches or fever.  All questions answered at this time.  RTC in 2 months for 2nd dose of shingrix vaccine to complete series September 18, or sooner as needed.

## 2021-11-12 ENCOUNTER — Encounter: Payer: Self-pay | Admitting: Physician Assistant

## 2021-11-12 ENCOUNTER — Ambulatory Visit: Payer: PRIVATE HEALTH INSURANCE | Admitting: Physician Assistant

## 2021-11-12 DIAGNOSIS — D485 Neoplasm of uncertain behavior of skin: Secondary | ICD-10-CM

## 2021-11-12 DIAGNOSIS — L820 Inflamed seborrheic keratosis: Secondary | ICD-10-CM

## 2021-11-12 NOTE — Patient Instructions (Signed)

## 2021-11-25 ENCOUNTER — Encounter: Payer: Self-pay | Admitting: Physician Assistant

## 2021-11-25 NOTE — Progress Notes (Signed)
   Follow-Up Visit   Subjective  Melanie Valenzuela is a 61 y.o. female who presents for the following: Skin Problem (Patient here today to have lesions removed on both sides of her hair line x years per patient the lesions are irritating. Patient states that she also has a lesion on her right cheek x unsure that she would like removed. ).   The following portions of the chart were reviewed this encounter and updated as appropriate:  Tobacco  Allergies  Meds  Problems  Med Hx  Surg Hx  Fam Hx      Objective  Well appearing patient in no apparent distress; mood and affect are within normal limits.  A focused examination was performed including face. Relevant physical exam findings are noted in the Assessment and Plan.  Right Parietal Scalp Thick brown crust   Assessment & Plan  Neoplasm of uncertain behavior of skin Right Parietal Scalp  Skin / nail biopsy Type of biopsy: tangential   Informed consent: discussed and consent obtained   Timeout: patient name, date of birth, surgical site, and procedure verified   Anesthesia: the lesion was anesthetized in a standard fashion   Anesthetic:  1% lidocaine w/ epinephrine 1-100,000 local infiltration Instrument used: flexible razor blade   Hemostasis achieved with: aluminum chloride   Outcome: patient tolerated procedure well   Post-procedure details: sterile dressing applied and wound care instructions given   Dressing type: petrolatum    Specimen 1 - Surgical pathology Differential Diagnosis: R/O ISK  Check Margins: No    I, Ieesha Abbasi, PA-C, have reviewed all documentation's for this visit.  The documentation on 11/25/21 for the exam, diagnosis, procedures and orders are all accurate and complete.

## 2022-02-03 ENCOUNTER — Ambulatory Visit: Payer: PRIVATE HEALTH INSURANCE | Admitting: Nurse Practitioner

## 2022-02-10 ENCOUNTER — Ambulatory Visit: Payer: PRIVATE HEALTH INSURANCE | Admitting: Nurse Practitioner

## 2022-02-10 DIAGNOSIS — Z23 Encounter for immunization: Secondary | ICD-10-CM

## 2022-02-10 NOTE — Progress Notes (Signed)
2nd Dose of Shingrix given today on left deltoid. Lot: [id number] and [id number], Expiration date: 11/22/22 Tolerated well. No immediate adverse reactions noted. Educated and discussed red flag symptoms and to call 911 or go to emergency room with signs of anaphylaxis.  Take OTC tylenol as needed for expected side effects such as muscle aches or fever.  Patient given the chance to ask all questions and discussed answers.  RTC as needed.

## 2022-05-06 ENCOUNTER — Other Ambulatory Visit: Payer: Self-pay | Admitting: Obstetrics and Gynecology

## 2022-05-06 DIAGNOSIS — Z1231 Encounter for screening mammogram for malignant neoplasm of breast: Secondary | ICD-10-CM

## 2022-08-12 ENCOUNTER — Ambulatory Visit
Admission: RE | Admit: 2022-08-12 | Discharge: 2022-08-12 | Disposition: A | Discharge status: Home / Self Care | Payer: No Typology Code available for payment source | Source: Ambulatory Visit | Attending: Obstetrics and Gynecology | Admitting: Obstetrics and Gynecology

## 2022-08-12 DIAGNOSIS — Z1231 Encounter for screening mammogram for malignant neoplasm of breast: Secondary | ICD-10-CM

## 2022-08-26 NOTE — Progress Notes (Signed)
62 y.o. G1P2002 Single Caucasian female here for annual exam.    No GYN concerns.  No vaginal bleeding or spotting.   She would like a pap and HR HPV testing today.  She is willing to pay if the insurance does not pay for the pap and HPR test.   Wants refill of Macrodantin for UTI prophylaxis.  Uses it not often.  Mother dx with lung cancer.   PCP:   Dr. Clelia Croft  Patient's last menstrual period was 09/11/2015 (lmp unknown).           Sexually active: Yes.    The current method of family planning is post menopausal status.    Exercising: Yes.     Lifting weights everyday Smoker:  no  Health Maintenance: Pap:  08/08/21 neg: HR HPV neg, 08/01/20 neg: HR HPV neg History of abnormal Pap:  yes, 12-15-18 normal Pos HR HPV-06-24-18-Colpo LGSIL  MMG:  08/12/22 Breast Density Cat B, BI-RADS CAT 1 neg Colonoscopy:  2021 normal - due in 10 years. BMD:   n/a  Result  n/a TDaP:  01/07/17 Gardasil:   no HIV: 09/08/16 NR Hep C: 09/08/16 neg Screening Labs:  PCP   reports that she has never smoked. She has never used smokeless tobacco. She reports that she does not drink alcohol and does not use drugs.  Past Medical History:  Diagnosis Date   ASCUS with positive high risk HPV cervical 05/2018   Colposcopy ECC with low-grade atypia.  Follow-up Pap smear 11/2018 showed normal cytology with positive high-risk HPV screen   Migraine    with menses    Past Surgical History:  Procedure Laterality Date   CARPAL TUNNEL RELEASE     Bilateral   CERVICAL CONE BIOPSY  2006   INTRAUTERINE DEVICE INSERTION  06/29/2013   Mirena has been removed.   KNEE SURGERY     right, knee scope X3   OOPHORECTOMY     Laparotomy with right ovarian cystectomy    Current Outpatient Medications  Medication Sig Dispense Refill   FLUoxetine (PROZAC) 20 MG capsule Take 20 mg by mouth daily.     nitrofurantoin (MACRODANTIN) 50 MG capsule Take one capsule (50 mg) by mouth as needed after intercourse 30 capsule 1   Protein  POWD See admin instructions.     SUMAtriptan (IMITREX) 100 MG tablet TAKE 1 TAB EVERY 2 HOURS AS NEEDED FOR MIGRAINE.REPEAT IN 2 HOURS IF HEADACHE PERSISTS OR RECURS. 9 tablet 1   VITAMIN D PO Take by mouth.     No current facility-administered medications for this visit.    Family History  Problem Relation Age of Onset   Breast cancer Mother        precancerous cells in breast--40's   Lung cancer Mother    Hypertension Father    Breast cancer Maternal Grandmother        age unknown   Breast cancer Maternal Aunt        40's   Breast cancer Maternal Aunt        PRE-CANCEROUS CELLS--40's   Breast cancer Maternal Aunt        40's   Breast cancer Maternal Aunt        40's   Breast cancer Cousin        Mat side- 40's    Review of Systems  All other systems reviewed and are negative.   Exam:   BP 112/72 (BP Location: Left Arm, Patient Position: Sitting, Cuff Size: Normal)  Pulse 72   Ht 5\' 1"  (1.549 m)   Wt 145 lb (65.8 kg)   LMP 09/11/2015 (LMP Unknown)   SpO2 98%   BMI 27.40 kg/m     General appearance: alert, cooperative and appears stated age Head: normocephalic, without obvious abnormality, atraumatic Neck: no adenopathy, supple, symmetrical, trachea midline and thyroid normal to inspection and palpation Lungs: clear to auscultation bilaterally Breasts: normal appearance, no masses or tenderness, No nipple retraction or dimpling, No nipple discharge or bleeding, No axillary adenopathy Heart: regular rate and rhythm Abdomen: soft, non-tender; no masses, no organomegaly Extremities: extremities normal, atraumatic, no cyanosis or edema Skin: skin color, texture, turgor normal. No rashes or lesions Lymph nodes: cervical, supraclavicular, and axillary nodes normal. Neurologic: grossly normal  Pelvic: External genitalia:  no lesions              No abnormal inguinal nodes palpated.              Urethra:  normal appearing urethra with no masses, tenderness or lesions               Bartholins and Skenes: normal                 Vagina: normal appearing vagina with normal color and discharge, no lesions              Cervix: no lesions              Pap taken: yes Bimanual Exam:  Uterus:  normal size, contour, position, consistency, mobility, non-tender              Adnexa: no mass, fullness, tenderness              Rectal exam: yes.  Confirms.              Anus:  normal sphincter tone, no lesions  Chaperone was present for exam:  Warren Lacy, CMA  Assessment:   Well woman visit with gynecologic exam. Hx LGSIL 2021.   Status post prior conization and LEEP. Strong FH breast cancer.  Anxiety and depression.  On Prozac. Hx laparotomy with right ovarian cystectomy. Post coital UTI.  Plan: Mammogram screening discussed. Self breast awareness reviewed. Referral for genetic counseling and possible testing.  Pap and HR HPV collected. Guidelines for Calcium, Vitamin D, regular exercise program including cardiovascular and weight bearing exercise. Rx for Macrodantin for postcoital UTI prevention.  Follow up annually and prn.   After visit summary provided.

## 2022-09-09 ENCOUNTER — Ambulatory Visit (INDEPENDENT_AMBULATORY_CARE_PROVIDER_SITE_OTHER): Payer: No Typology Code available for payment source | Admitting: Obstetrics and Gynecology

## 2022-09-09 ENCOUNTER — Other Ambulatory Visit (HOSPITAL_COMMUNITY)
Admission: RE | Admit: 2022-09-09 | Discharge: 2022-09-09 | Disposition: A | Discharge status: Home / Self Care | Payer: No Typology Code available for payment source | Source: Ambulatory Visit | Attending: Obstetrics and Gynecology | Admitting: Obstetrics and Gynecology

## 2022-09-09 ENCOUNTER — Encounter: Payer: Self-pay | Admitting: Obstetrics and Gynecology

## 2022-09-09 VITALS — BP 112/72 | HR 72 | Ht 61.0 in | Wt 145.0 lb

## 2022-09-09 DIAGNOSIS — Z87410 Personal history of cervical dysplasia: Secondary | ICD-10-CM | POA: Insufficient documentation

## 2022-09-09 DIAGNOSIS — Z01419 Encounter for gynecological examination (general) (routine) without abnormal findings: Secondary | ICD-10-CM

## 2022-09-09 DIAGNOSIS — Z124 Encounter for screening for malignant neoplasm of cervix: Secondary | ICD-10-CM

## 2022-09-09 DIAGNOSIS — Z803 Family history of malignant neoplasm of breast: Secondary | ICD-10-CM

## 2022-09-09 MED ORDER — NITROFURANTOIN MACROCRYSTAL 50 MG PO CAPS: ORAL_CAPSULE | ORAL | 1 refills | Status: DC

## 2022-09-09 NOTE — Patient Instructions (Signed)

## 2022-09-15 LAB — CYTOLOGY - PAP
Comment: NEGATIVE
Diagnosis: NEGATIVE
High risk HPV: NEGATIVE

## 2022-11-11 ENCOUNTER — Inpatient Hospital Stay: Payer: PRIVATE HEALTH INSURANCE | Admitting: Genetic Counselor

## 2022-11-11 ENCOUNTER — Inpatient Hospital Stay: Payer: PRIVATE HEALTH INSURANCE

## 2022-12-11 ENCOUNTER — Telehealth: Payer: Self-pay

## 2022-12-11 ENCOUNTER — Ambulatory Visit: Payer: No Typology Code available for payment source | Admitting: Radiology

## 2022-12-11 VITALS — BP 126/82

## 2022-12-11 DIAGNOSIS — R300 Dysuria: Secondary | ICD-10-CM

## 2022-12-11 DIAGNOSIS — N9089 Other specified noninflammatory disorders of vulva and perineum: Secondary | ICD-10-CM

## 2022-12-11 LAB — URINALYSIS, COMPLETE W/RFL CULTURE
Bacteria, UA: NONE SEEN /HPF
Bilirubin Urine: NEGATIVE
Glucose, UA: NEGATIVE
Hyaline Cast: NONE SEEN /LPF
Ketones, ur: NEGATIVE
Leukocyte Esterase: NEGATIVE
Nitrites, Initial: NEGATIVE
Protein, ur: NEGATIVE
RBC / HPF: NONE SEEN /HPF (ref 0–2)
Specific Gravity, Urine: 1.02 (ref 1.001–1.035)
WBC, UA: NONE SEEN /HPF (ref 0–5)
pH: 5.5 (ref 5.0–8.0)

## 2022-12-11 LAB — WET PREP FOR TRICH, YEAST, CLUE

## 2022-12-11 LAB — NO CULTURE INDICATED

## 2022-12-11 NOTE — Telephone Encounter (Signed)
Pt LVM stating that she forgot to ask you for a refill for her macrobid during her appt today.  Rx was sent for #30 w/ 1 refill on 09/09/2022.  Spoke w/ pharmacy and they reported that they still had that one refill left for her and will get it ready.   Pt advised and voiced understanding/appreciation for call.

## 2022-12-11 NOTE — Progress Notes (Signed)
      Subjective: Melanie Valenzuela is a 62 y.o. female who complains of spotting she noticed with wiping this morning, burns when urine touches skin, no urinary frequency or urgency. Last intercourse 6 days ago. No recollection of injury to the area. Does have vaginal dryness.    Review of Systems  All other systems reviewed and are negative.   Past Medical History:  Diagnosis Date   ASCUS with positive high risk HPV cervical 05/2018   Colposcopy ECC with low-grade atypia.  Follow-up Pap smear 11/2018 showed normal cytology with positive high-risk HPV screen   Migraine    with menses      Objective:  Today's Vitals   12/11/22 1504  BP: 126/82   There is no height or weight on file to calculate BMI.   Physical Exam Exam conducted with a chaperone present.  Constitutional:      Appearance: Normal appearance. She is normal weight.  Pulmonary:     Effort: Pulmonary effort is normal.  Genitourinary:    Vagina: No signs of injury. Erythema present. No vaginal discharge, tenderness, bleeding or lesions.       Comments: Moderately atrophic Neurological:     Mental Status: She is alert.      Urine dipstick shows positive for RBC's.  Micro exam: negative for WBC's or RBC's.  Microscopic wet-mount exam shows negative for pathogens, normal epithelial cells.   Raynelle Fanning, CMA present for exam  Assessment:/Plan:   1. Dysuria Reassured negative wet mount Likely related to lesion directly at 1 oclock over urethra - Urinalysis,Complete w/RFL Culture  2. Vulvar lesion - WET PREP FOR TRICH, YEAST, CLUE; neg - SureSwab HSV, Type 1/2 DNA, PCR Begin using a lubricant with intercourse, recommended coconut oil or uber lube. Declines vaginal estrogen, will try Key-E suppositories for dryness   Will contact patient with results of testing completed today. Avoid intercourse until symptoms are resolved. Safe sex encouraged. Avoid the use of soaps or perfumed products in the peri  area. Avoid tub baths and sitting in sweaty or wet clothing for prolonged periods of time.

## 2022-12-15 LAB — SURESWAB HSV, TYPE 1/2 DNA, PCR
HSV 1 DNA: NOT DETECTED
HSV 2 DNA: NOT DETECTED

## 2023-02-15 LAB — LAB REPORT - SCANNED: EGFR: 92

## 2023-02-18 ENCOUNTER — Encounter: Payer: Self-pay | Admitting: Cardiology

## 2023-02-18 ENCOUNTER — Ambulatory Visit: Payer: No Typology Code available for payment source | Attending: Cardiology | Admitting: Cardiology

## 2023-02-18 ENCOUNTER — Ambulatory Visit: Payer: No Typology Code available for payment source

## 2023-02-18 ENCOUNTER — Other Ambulatory Visit: Payer: Self-pay | Admitting: Family Medicine

## 2023-02-18 ENCOUNTER — Ambulatory Visit
Admission: RE | Admit: 2023-02-18 | Discharge: 2023-02-18 | Disposition: A | Discharge status: Home / Self Care | Payer: No Typology Code available for payment source | Source: Ambulatory Visit | Attending: Family Medicine | Admitting: Family Medicine

## 2023-02-18 VITALS — BP 122/62 | HR 63 | Ht 61.0 in | Wt 137.8 lb

## 2023-02-18 DIAGNOSIS — I493 Ventricular premature depolarization: Secondary | ICD-10-CM

## 2023-02-18 DIAGNOSIS — R002 Palpitations: Secondary | ICD-10-CM | POA: Insufficient documentation

## 2023-02-18 NOTE — Assessment & Plan Note (Signed)
Assessment and Plan    Persistent palpitations for the past two weeks, described as forceful and irregular. No associated shortness of breath, dizziness, or chest pain. No significant exercise intolerance. Recent history of COVID-19 infection. EKG showed PVCs. -Order a 7-day Zio patch monitor to evaluate the nature and frequency of the arrhythmia. -Encourage adequate hydration. -Follow-up in 3-4 weeks to discuss the results of the monitor and potential treatment options, including possible use of a beta-blocker if necessary.

## 2023-02-18 NOTE — Progress Notes (Signed)
Cardiology Office Note:  .   Date:  02/18/2023  ID:  Melanie Valenzuela, DOB [birth year], MRN [id number] PCP: Lupita Raider, MD  [facility] Providers Cardiologist:  None     Chief Complaint  Patient presents with   Palpitations    Patient states that it she has been feeling palpitations for weeks. Patient's pulse has been high.Patient had Covid 9/11 prior to symptoms. Meds reviewed.    Patient Profile: .     Melanie Valenzuela is an otherwise healthy 62 y.o. female with a PMH notable for Anxiety (dysthymia) & RARE Migraines HA who presents here for evaluations of FORCEFUL/ABNORMAL HEART  BEATS at the request of Lupita Raider, MD.   Melanie Valenzuela was last on 02/14/2023 by Reubin Milan, M.  She noted more frequent heart rhythm problem/palpitations or skipped beats over the last 2 weeks.  Referred to cardiology.,  Subjective  Discussed the use of AI scribe software for clinical note transcription with the patient, who gave verbal consent to proceed.  History of Present Illness   The patient, previously under the care of Dr. Katrinka Blazing, presents with a chief complaint of palpitations, described as a sensation of her heart "constantly out of her chest" for the past two weeks. The patient describes the palpitations as irregular and forceful, sometimes feeling as if she is in the throat. These sensations are not associated with any particular activity and are noticed throughout the day, even at rest. The patient denies any associated shortness of breath, dizziness, or lightheadedness, attributing any such feelings to anxiety.  The patient maintains an active lifestyle, working out daily for an hour, during which she does not notice the palpitations. She has a history of migraine headaches, for which she takes medication infrequently, and she also takes Prozac intermittently for stress and work-related anxiety.  The patient had a recent COVID-19 infection in September, and she also  mentioned a history of excessive caffeine intake, which she has since stopped. She maintains good hydration, drinking at least 64 ounces of fluids daily.  The patient has a history of PVCs, as noted in a previous EKG. She also mentioned a possible diagnosis of mitral valve prolapse many years ago, but this has not been confirmed in recent years. The patient does not report any significant changes in exercise tolerance, attributing any minor changes to age.  The patient's palpitations are persistent and have been increasing in frequency over the past two weeks. The patient expresses concern about the palpitations, particularly in relation to her daily workouts. She is eager to understand the cause of these symptoms and is open to monitoring to help identify the issue.     Cardiovascular ROS: no chest pain or dyspnea on exertion positive for - irregular heartbeat, palpitations, and rapid heart rate negative for - edema, loss of consciousness, orthopnea, paroxysmal nocturnal dyspnea, shortness of breath, or dizziness/near syncope, TIA or amaurosis fugax, claudication  ROS:  Review of Systems - Negative except symptoms noted above    Objective   Studies Reviewed: Marland Kitchen   EKG Interpretation Date/Time:  Thursday February 18 2023 08:31:58 EDT Ventricular Rate:  63 PR Interval:  124 QRS Duration:  72 QT Interval:  436 QTC Calculation: 446 R Axis:   -19  Text Interpretation: Normal sinus rhythm Low voltage QRS When compared with ECG of 13-Nov-2018 08:00, PREVIOUS ECG IS PRESENT Confirmed by Bryan Lemma ([id number]) on 02/18/2023 9:34:05 AM    ECHO January 2006: [facility].  Normal LV size and  function EF 70%; normal RV.  Normal atria.  Normal valves.  Labs 02/14/2023: WBC 4.8, Hgb 13.3, PLT 183; TSH 2.64; GLU 115, BUN/creatinine 24/0.74, NA 140, K+ 4.0.  TC 207, TG 94, HDL 58, LDL 132.  Risk Assessment/Calculations:            Physical Exam:   VS:  BP 122/62 (BP Location: Left Arm, Patient  Position: Sitting, Cuff Size: Normal)   Pulse 63   Ht 5\' 1"  (1.549 m)   Wt 137 lb 12.8 oz (62.5 kg)   LMP 09/11/2015 (LMP Unknown)   SpO2 99%   BMI 26.04 kg/m    Wt Readings from Last 3 Encounters:  02/18/23 137 lb 12.8 oz (62.5 kg)  09/09/22 145 lb (65.8 kg)  08/08/21 148 lb (67.1 kg)    GEN: Well nourished, well developed in no acute distress; healthy-appearing.  Well-groomed. NECK: No JVD; No carotid bruits CARDIAC: Normal S1, S2; RRR, no murmurs, rubs, gallops RESPIRATORY:  Clear to auscultation without rales, wheezing or rhonchi ; nonlabored, good air movement. ABDOMEN: Soft, non-tender, non-distended EXTREMITIES:  No edema; No deformity     ASSESSMENT AND PLAN: .    Problem List Items Addressed This Visit     Palpitations - Primary    Assessment and Plan    Persistent palpitations for the past two weeks, described as forceful and irregular. No associated shortness of breath, dizziness, or chest pain. No significant exercise intolerance. Recent history of COVID-19 infection. EKG showed PVCs. -Order a 7-day Zio patch monitor to evaluate the nature and frequency of the arrhythmia. -Encourage adequate hydration. -Follow-up in 3-4 weeks to discuss the results of the monitor and potential treatment options, including possible use of a beta-blocker if necessary.            Relevant Orders   EKG 12-Lead (Completed)   LONG TERM MONITOR (3-14 DAYS)       Follow-Up: Return in about 4 weeks (around 03/18/2023) for Routine Follow-up after testing ~ 1-2 months, Follow-up in [facility] office.  Total time spent: 18 min spent with patient + 14 min spent charting = 32 min     Signed, Marykay Lex, MD, MS Bryan Lemma, M.D., M.S. Interventional Cardiologist  [facility]  Pager # [phone] Phone # [phone] [address] [place], [place] [address]

## 2023-02-18 NOTE — Patient Instructions (Signed)
Medication Instructions:  Your physician recommends that you continue on your current medications as directed. Please refer to the Current Medication list given to you today.  *If you need a refill on your cardiac medications before your next appointment, please call your pharmacy*  Lab Work: - None ordered  Testing/Procedures: Heart Monitor:  Your physician has requested you wear a ZIO XT patch heart monitor for 7 days.  Your monitor will be mailed to your home address within 3-5 business days. This is sent via [employer] from [employer]. However, if you have not received your monitor after 5 business days please send [place] a MyChart message or call the office at [phone], so we may follow up on this for you.   This monitor is a medical device (single patch monitor) that records the heart's electrical activity. Doctors most often use these monitors to diagnose arrhythmias. Arrhythmias are problems with the speed or rhythm of the heartbeat.   [employer] supplies 1 patch per enrollment. Additional stickers are not available.  Please DO NOT apply the patch if you will be having a Nuclear Stress Test, Echocardiogram, Cardiac CT, Cardiac MRI, Chest X-ray during the period you would be wearing the monitor. The patch cannot be worn during these tests.  You cannot remove and re-apply the ZIO patch monitor.   Applying the Monitor: Once you receive your monitor, this will include a small razor, abrader, and 4 alcohol pads. YouTube search Zio patch heart monitor video is 3:46 for step-by-step application instructions Shave hair from upper left chest Rub abrader disc in 40 strokes over the left upper chest as indicated in your monitor instructions Clean area with 4 enclosed alcohol pads (there may be a mild & brief stinging sensation over the newly abraded area, but this is normal). Let dry Apply patch as indicated in monitor instructions. Patch will be placed under collarbone on the left side of  the chest with arrow pointing upward. Rub adhesive wings for 2 minutes. Remove white label marked "1". Remove the white label marked "2". Rub patch adhesive wings for an 2 minutes.  While looking in a mirror, press and release button in the center of the patch. You may hear a "click". A small green light will flash 4-6 times and then stop. This will be your indicator that the monitor has been turned on.  Wearing the Monitor: Avoid showering during the first 24 hours of wearing the monitor.  After 24 hours you may shower with the patch on. Take brief showers with your back facing the shower head.  Avoid excessive sweating to help maximize wear time. Do not submerge the device, no hot tubs, and no swimming pools. Keep any lotions or oils away from the patch. Press the button if you feel a symptom. You will hear a small click. Record date, time, and symptoms in the Patient Logbook or App.  Monitor Issues: Call [employer] Customer Care at [phone] if you have questions regarding your Zio Patch Monitor. Call them immediately if you see an orange/ amber colored light blinking on your monitor. If your monitor falls off and you cannot get this reapplied or if you need suggestions for securing your monitor call [employer] at [phone].   Returning the Monitor: Once you have completed wearing your monitor, follow instructions on the last 2 pages of the Patient Logbook. Stick monitor patch on to the last page of the Patient Logbook.  Place Patient Logbook with monitor in the return box provided.  Use locking tab on box and tape box closed securely. The return box has pre-paid postage on it.  Place the return box in the regular [place] Mail box as soon as possible It will take anywhere from 1-2 weeks for your provider to receive and review your results once you mail this back. If for some reason you have misplaced your return box then call our office and we can provide another box and/or mail  it off for you.   Billing  and Patient Assistance Program Information: We have supplied [employer] with any of your insurance information on file for billing purposes. [employer] offers a sliding scale Patient Assistance Program for patients that do not have insurance, or whose insurance does not completely cover the cost of the ZIO monitor. You must apply for the Patient Assistance Program to qualify for this discounted rate. To apply, please call [employer] at [phone], select option 1, ask to apply for the Patient Assistance Program. [employer] will ask your household income, and how many people are in your household. They will quote your out-of-pocket cost based on that information. [employer] will also be able to set up for a 41-month, interest-free payment plan if needed.      Follow-Up: At [facility], you and your health needs are our priority.  As part of our continuing mission to provide you with exceptional heart care, we have created designated Provider Care Teams.  These Care Teams include your primary Cardiologist (physician) and Advanced Practice Providers (APPs -  Physician Assistants and Nurse Practitioners) who all work together to provide you with the care you need, when you need it.  Your next appointment:   4 - 6 week(s)  Provider:   Bryan Lemma, MD    Other Instructions - None

## 2023-02-24 DIAGNOSIS — R002 Palpitations: Secondary | ICD-10-CM

## 2023-03-02 ENCOUNTER — Ambulatory Visit: Payer: PRIVATE HEALTH INSURANCE | Admitting: Internal Medicine

## 2023-03-21 NOTE — Progress Notes (Unsigned)
Cardiology Office Note    Date:  03/23/2023   ID:  Melanie Valenzuela, DOB [birth year], MRN [id number]  PCP:  Lupita Raider, MD  Cardiologist:  Bryan Lemma, MD  Electrophysiologist:  None   Chief Complaint: Follow-up  History of Present Illness:   Melanie Valenzuela is a 62 y.o. female with history of recurrent syncope with one EKG showing possible QT prolongation and PSVT who presents for follow-up of Zio patch.  Remote echo in 2006 showed preserved LV systolic function with no significant valvular abnormalities.  She was evaluated by Dr. Katrinka Blazing in 2021 for recurrent syncope that occurred 6 months prior after strenuous physical activity.  There was concern for possible QT prolongation on EKG, though it was difficult to see clearly defined T waves.  She had returned to normal activity without recurrent symptoms.  More recently, she was evaluated by Dr. Herbie Baltimore in 01/2023 with a 2-week history of increasing palpitations that were not associated with any particular activity.  She maintained an active lifestyle.  She reported a recent COVID infection and previously noted significant caffeine intake, though had subsequently stopped caffeine at time of cardiology appointment.  EKG showed sinus rhythm with normal QTc.  Outpatient cardiac monitor showed a predominant rhythm of sinus with an average rate of 70 bpm (range 44 to 156 bpm), 12 episodes of SVT occurred lasting up to 12 beats and were detected with symptomatic events, rare atrial ectopy, and occasional isolated PVCs with an overall burden of 3.8%.  She comes in doing well from a cardiac perspective and is without symptoms of angina or cardiac decompensation.  Since she was last seen and her palpitation burden has overall improved, though she does continue to note isolated palpitations described as a clunk with a pause, consistent with PVC.  She reports she did not trigger all patient's symptomatic episodes while wearing Zio patch given they  were occurring all of the time.  She also indicates she was under increased stress in the context of her increased palpitation burden with 2 of her sons having lost their dogs, and in this setting was drinking increased amounts of caffeine.  She has since cut out nearly all caffeine, drinking decaf coffee and a rare Diet Coke.  Rare alcohol use socially, once or twice per month.  No alcohol or illicit substance use.  No dizziness, presyncope, or syncope.  No chest pain or dyspnea.  Palpitations are more noticeable at rest at this time and appear to be consistent with PVCs.  She brings in a report of chest x-ray performed in 01/2023 that showed no active cardiopulmonary process.  Echo performed at outside office demonstrated preserved LV systolic function, grade 1 diastolic dysfunction, trivial mitral regurgitation, aortic valve sclerosis without evidence of stenosis, and normal RV systolic function, ventricular cavity size, and RVSP.  Report will be scanned in.  She also reports outside office has obtained labs including CBC, kidney function and electrolytes, and thyroid function.  She reports these labs were unrevealing.   Labs independently reviewed: 01/2023 - magnesium 2.2 06/2019 - Hgb 14, PLT 198, TC 183, TG 56, HDL 64, LDL 108  Past Medical History:  Diagnosis Date   ASCUS with positive high risk HPV cervical 05/2018   Colposcopy ECC with low-grade atypia.  Follow-up Pap smear 11/2018 showed normal cytology with positive high-risk HPV screen   Migraine    with menses    Past Surgical History:  Procedure Laterality Date   CARPAL TUNNEL RELEASE  Bilateral   CERVICAL CONE BIOPSY  2006   INTRAUTERINE DEVICE INSERTION  06/29/2013   Mirena has been removed.   KNEE SURGERY     right, knee scope X3   OOPHORECTOMY     Laparotomy with right ovarian cystectomy    Current Medications: Current Meds  Medication Sig   metoprolol tartrate (LOPRESSOR) 25 MG tablet Take 0.5 tablets (12.5 mg  total) by mouth 2 (two) times daily as needed (for PALPITATIONS).    Allergies:   Patient has no known allergies.   Social History   Socioeconomic History   Marital status: Single    Spouse name: Not on file   Number of children: Not on file   Years of education: Not on file   Highest education level: Not on file  Occupational History   Not on file  Tobacco Use   Smoking status: Never   Smokeless tobacco: Never  Vaping Use   Vaping status: Never Used  Substance and Sexual Activity   Alcohol use: No    Alcohol/week: 0.0 standard drinks of alcohol   Drug use: No   Sexual activity: Yes    Partners: Male    Birth control/protection: Post-menopausal    Comment: 1st intercourse 62 yo-Fewer than 5 partners  Other Topics Concern   Not on file  Social History Narrative   Not on file   Social Determinants of Health   Financial Resource Strain: Not on file  Food Insecurity: Not on file  Transportation Needs: Not on file  Physical Activity: Not on file  Stress: Not on file  Social Connections: Not on file     Family History:  The patient's family history includes Breast cancer in her cousin, maternal aunt, maternal aunt, maternal aunt, maternal aunt, maternal grandmother, and mother; Hypertension in her father; Lung cancer in her mother.  ROS:   12-point review of systems is negative unless otherwise noted in the HPI.   EKGs/Labs/Other Studies Reviewed:    Studies reviewed were summarized above. The additional studies were reviewed today:  Zio patch 01/2023: Patient had a min HR of 44 bpm, max HR of 156 bpm, and avg HR of 70 bpm. Predominant underlying rhythm was Sinus Rhythm. 12 Supraventricular Tachycardia runs occurred, the run with the fastest interval lasting 5 beats with a max rate of 156 bpm, the  longest lasting 12 beats with an avg rate of 132 bpm. Supraventricular Tachycardia was detected within +/- 45 seconds of symptomatic patient event(s). Isolated SVEs were  rare (<1.0%), SVE Couplets were rare (<1.0%), and SVE Triplets were rare (<1.0%).  Isolated VEs were occasional (3.8%, [id number]), and no VE Couplets or VE Triplets were present. Ventricular Bigeminy and Trigeminy were present.  __________  2D echo 05/02/2004: LVEF 70%, no significant valvular abnormalities   EKG:  EKG is not ordered today.    Recent Labs: No results found for requested labs within last 365 days.  Recent Lipid Panel    Component Value Date/Time   CHOL 193 06/17/2018 1013   TRIG 55 06/17/2018 1013   HDL 64 06/17/2018 1013   CHOLHDL 3.0 06/17/2018 1013   VLDL 14 05/19/2016 1602   LDLCALC 115 (H) 06/17/2018 1013    PHYSICAL EXAM:    VS:  BP 118/80 (BP Location: Left Arm, Patient Position: Sitting, Cuff Size: Normal)   Pulse 75   Ht 5\' 1"  (1.549 m)   Wt 143 lb 9.6 oz (65.1 kg)   LMP 09/11/2015 (LMP Unknown)   SpO2 100%  BMI 27.13 kg/m   BMI: Body mass index is 27.13 kg/m.  Physical Exam Vitals reviewed.  Constitutional:      Appearance: She is well-developed.  HENT:     Head: Normocephalic and atraumatic.  Eyes:     General:        Right eye: No discharge.        Left eye: No discharge.  Neck:     Vascular: No JVD.  Cardiovascular:     Rate and Rhythm: Normal rate and regular rhythm.     Heart sounds: Normal heart sounds, S1 normal and S2 normal. Heart sounds not distant. No midsystolic click and no opening snap. No murmur heard.    No friction rub.  Pulmonary:     Effort: Pulmonary effort is normal. No respiratory distress.     Breath sounds: Normal breath sounds. No decreased breath sounds, wheezing, rhonchi or rales.  Chest:     Chest wall: No tenderness.  Abdominal:     General: There is no distension.  Musculoskeletal:     Cervical back: Normal range of motion.  Skin:    General: Skin is warm and dry.     Nails: There is no clubbing.  Neurological:     Mental Status: She is alert and oriented to person, place, and time.  Psychiatric:         Speech: Speech normal.        Behavior: Behavior normal.        Thought Content: Thought content normal.        Judgment: Judgment normal.     Wt Readings from Last 3 Encounters:  03/23/23 143 lb 9.6 oz (65.1 kg)  02/18/23 137 lb 12.8 oz (62.5 kg)  09/09/22 145 lb (65.8 kg)     ASSESSMENT & PLAN:   PSVT/PVCs: Overall, symptomatic burden has improved with episodes now more noticeable at rest, possibly in the setting of decreased distractions with daily activity.  Reassuring Zio patch with no sustained significant arrhythmias with an overall 3.8% burden of PVCs.  Echo obtained at outside office that showed no significant structural abnormalities.  We discussed daily pharmacotherapy with Toprol-XL 12.5 mg versus as needed Lopressor 12.5 mg twice daily, ultimately electing to go with the latter in an effort to minimize daily pharmacotherapy.  She reports unrevealing laboratory workup.  Continue to abstain from significant caffeine and alcohol use.  No indication for further cardiac testing at this time.  History of possible prolonged QT with vasovagal syncope: Isolated EKG in 2021 normal with the exception of generalized flattened T waves and difficult to discern how QT interval was measured.  Possibly electrolyte/metabolic disturbance at that time.  No further episodes, despite with return of strenuous activity at that time.  Echo without any significant structural abnormalities.  EKG from last month showed normal QTc.       Disposition: F/u with Dr. Herbie Baltimore or an APP in 1 month.   Medication Adjustments/Labs and Tests Ordered: Current medicines are reviewed at length with the patient today.  Concerns regarding medicines are outlined above. Medication changes, Labs and Tests ordered today are summarized above and listed in the Patient Instructions accessible in Encounters.   Signed, Eula Listen, PA-C 03/23/2023 5:15 PM     [facility] [place] [address] [place], [place] [address] [phone]

## 2023-03-23 ENCOUNTER — Ambulatory Visit: Payer: No Typology Code available for payment source | Attending: Physician Assistant | Admitting: Physician Assistant

## 2023-03-23 ENCOUNTER — Encounter: Payer: Self-pay | Admitting: Physician Assistant

## 2023-03-23 VITALS — BP 118/80 | HR 75 | Ht 61.0 in | Wt 143.6 lb

## 2023-03-23 DIAGNOSIS — I493 Ventricular premature depolarization: Secondary | ICD-10-CM | POA: Diagnosis not present

## 2023-03-23 DIAGNOSIS — I471 Supraventricular tachycardia, unspecified: Secondary | ICD-10-CM

## 2023-03-23 DIAGNOSIS — R002 Palpitations: Secondary | ICD-10-CM

## 2023-03-23 MED ORDER — METOPROLOL TARTRATE 25 MG PO TABS: 12.5000 mg | ORAL_TABLET | Freq: Two times a day (BID) | ORAL | 3 refills | Status: AC | PRN

## 2023-03-23 NOTE — Patient Instructions (Signed)
Medication Instructions:  Your physician recommends the following medication changes.  START TAKING: Lopressor 12.5 mg twice ddaily as needed for palpitations  *If you need a refill on your cardiac medications before your next appointment, please call your pharmacy*   Lab Work: None ordered at this time    Follow-Up: At [facility], you and your health needs are our priority.  As part of our continuing mission to provide you with exceptional heart care, we have created designated Provider Care Teams.  These Care Teams include your primary Cardiologist (physician) and Advanced Practice Providers (APPs -  Physician Assistants and Nurse Practitioners) who all work together to provide you with the care you need, when you need it.  We recommend signing up for the patient portal called "MyChart".  Sign up information is provided on this After Visit Summary.  MyChart is used to connect with patients for Virtual Visits (Telemedicine).  Patients are able to view lab/test results, encounter notes, upcoming appointments, etc.  Non-urgent messages can be sent to your provider as well.   To learn more about what you can do with MyChart, go to [email or web address].    Your next appointment:   1 month(s)  Provider:   You may see Bryan Lemma, MD or one of the following Advanced Practice Providers on your designated Care Team:   Eula Listen, [place]

## 2023-03-31 NOTE — Progress Notes (Signed)
Labs from 02/14/2023: Na+ 140, K+ 4.0, Cl- 106, HCO3-26, BUN 24, Cr 0.74, Glu 115, Ca2+ 9.4; AST 23, ALT 20, AlkP 69; mag 2.2; TSH 2.64 CBC: W 4.8, H/H 13.3/39.3, Plt 183

## 2023-04-05 ENCOUNTER — Encounter: Payer: Self-pay | Admitting: Emergency Medicine

## 2023-07-26 ENCOUNTER — Ambulatory Visit: Payer: No Typology Code available for payment source | Admitting: Cardiology

## 2023-08-18 ENCOUNTER — Other Ambulatory Visit: Payer: Self-pay | Admitting: Family Medicine

## 2023-08-18 DIAGNOSIS — Z1231 Encounter for screening mammogram for malignant neoplasm of breast: Secondary | ICD-10-CM

## 2023-08-24 ENCOUNTER — Ambulatory Visit
Admission: RE | Admit: 2023-08-24 | Discharge: 2023-08-24 | Disposition: A | Discharge status: Home / Self Care | Source: Ambulatory Visit | Attending: Family Medicine | Admitting: Family Medicine

## 2023-08-24 DIAGNOSIS — Z1231 Encounter for screening mammogram for malignant neoplasm of breast: Secondary | ICD-10-CM

## 2023-08-26 ENCOUNTER — Encounter

## 2023-08-26 DIAGNOSIS — Z1231 Encounter for screening mammogram for malignant neoplasm of breast: Secondary | ICD-10-CM

## 2023-10-05 ENCOUNTER — Encounter: Payer: Self-pay | Admitting: Cardiology

## 2023-10-06 NOTE — Telephone Encounter (Signed)
 Mollyann that sounds great.  Happy to hear that you are feeling better.  I am perfectly fine with skipping the visit in July if you would like, maybe we can put you on the books for a year out from your visit with Varney Gentleman, PA.  That way we can keep an eye on you and if you are still doing well at that time then we can conclude that you are probably going to do well and can cancel that visit and come back on as-needed basis.  Otherwise if he are competent and you are feeling well.  Will be happy to see you at anytime with issues recur.Randene Bustard, MD

## 2023-10-20 ENCOUNTER — Ambulatory Visit: Admitting: Cardiology

## 2023-12-23 NOTE — Progress Notes (Signed)
 63 y.o. G52P2002 Single Caucasian female here for annual exam. Needs a refill for nitrofurantoin  50 mg that she takes to prevent postcoital UTI.  Really wants cervical cancer screening.  She knows insurance may not pay.    No partner change.   Declines STD screening.   Going to be a grandmother soon.   PCP: Loreli Kins, MD   Patient's last menstrual period was 09/11/2015 (lmp unknown).           Sexually active: Yes.    The current method of family planning is post menopausal status.    Menopausal hormone therapy:  n/a Exercising: Yes.    Weight lifting and cardio  Smoker:  no  OB History  Gravida Para Term Preterm AB Living  2 2 2   0 2  SAB IAB Ectopic Multiple Live Births    0  2    # Outcome Date GA Lbr Len/2nd Weight Sex Type Anes PTL Lv  2 Term           1 Term              HEALTH MAINTENANCE: Last 2 paps:  09/09/22 neg HR HPV neg, 08/08/21 neg, HR HPV neg History of abnormal Pap or positive HPV:  yes, 12-15-18 normal Pos HR HPV-06-24-18-Colpo LGSIL  Mammogram:   08/24/23 Breast Density Cat B, BIRADS Cat 1 neg  Colonoscopy:  2021 normal - due in 10 years.  Bone Density:  n/a  Result  n/a   Immunization History  Administered Date(s) Administered   Influenza Split 01/26/2012, 01/18/2013, 02/01/2014, 12/23/2017, 01/12/2019   Influenza,inj,Quad PF,6+ Mos 01/24/2016, 01/07/2017, 12/23/2017, 01/29/2020   Influenza,inj,quad, With Preservative 02/06/2015   PFIZER(Purple Top)SARS-COV-2 Vaccination 05/11/2019, 06/01/2019, 02/04/2020, 11/16/2020   Tdap 03/01/2007, 01/07/2017   Zoster Recombinant(Shingrix) 11/04/2021, 02/10/2022      reports that she has never smoked. She has never used smokeless tobacco. She reports that she does not drink alcohol and does not use drugs.  Past Medical History:  Diagnosis Date   ASCUS with positive high risk HPV cervical 05/2018   Colposcopy ECC with low-grade atypia.  Follow-up Pap smear 11/2018 showed normal cytology with positive  high-risk HPV screen   Migraine    with menses    Past Surgical History:  Procedure Laterality Date   CARPAL TUNNEL RELEASE     Bilateral   CERVICAL CONE BIOPSY  2006   INTRAUTERINE DEVICE INSERTION  06/29/2013   Mirena  has been removed.   KNEE SURGERY     right, knee scope X3   OOPHORECTOMY     Laparotomy with right ovarian cystectomy    Current Outpatient Medications  Medication Sig Dispense Refill   FLUoxetine  (PROZAC ) 20 MG capsule Take 20 mg by mouth every other day.     metoprolol  tartrate (LOPRESSOR ) 25 MG tablet Take 0.5 tablets (12.5 mg total) by mouth 2 (two) times daily as needed (for PALPITATIONS). 90 tablet 3   Multiple Vitamin (MULTI VITAMIN) TABS Take by mouth daily.     nitrofurantoin  (MACRODANTIN ) 50 MG capsule Take one capsule (50 mg) by mouth as needed after intercourse 30 capsule 1   Protein POWD See admin instructions.     Specialty Vitamins Products (COLLAGEN ULTRA) CAPS daily.     SUMAtriptan  (IMITREX ) 100 MG tablet TAKE 1 TAB EVERY 2 HOURS AS NEEDED FOR MIGRAINE.REPEAT IN 2 HOURS IF HEADACHE PERSISTS OR RECURS. 9 tablet 1   VITAMIN D  PO Take by mouth.     No current facility-administered  medications for this visit.    ALLERGIES: Patient has no known allergies.  Family History  Problem Relation Age of Onset   Breast cancer Mother        precancerous cells in breast--40's   Lung cancer Mother    Hypertension Father    Breast cancer Maternal Grandmother        age unknown   Breast cancer Maternal Aunt        40's   Breast cancer Maternal Aunt        PRE-CANCEROUS CELLS--40's   Breast cancer Maternal Aunt        40's   Breast cancer Maternal Aunt        40's   Breast cancer Cousin        Mat side- 40's    Review of Systems  All other systems reviewed and are negative.   PHYSICAL EXAM:  BP 114/78 (BP Location: Left Arm, Patient Position: Sitting)   Pulse 77   Ht 5' 1.5 (1.562 m)   Wt 136 lb (61.7 kg)   LMP 09/11/2015 (LMP Unknown)    SpO2 94%   BMI 25.28 kg/m     General appearance: alert, cooperative and appears stated age Head: normocephalic, without obvious abnormality, atraumatic Neck: no adenopathy, supple, symmetrical, trachea midline and thyroid normal to inspection and palpation Lungs: clear to auscultation bilaterally Breasts: normal appearance, no masses or tenderness, No nipple retraction or dimpling, No nipple discharge or bleeding, No axillary adenopathy Heart: regular rate and rhythm Abdomen: soft, non-tender; no masses, no organomegaly Extremities: extremities normal, atraumatic, no cyanosis or edema Skin: skin color, texture, turgor normal. No rashes or lesions Lymph nodes: cervical, supraclavicular, and axillary nodes normal. Neurologic: grossly normal  Pelvic: External genitalia:  no lesions              No abnormal inguinal nodes palpated.              Urethra:  normal appearing urethra with no masses, tenderness or lesions              Bartholins and Skenes: normal                 Vagina: normal appearing vagina with normal color and discharge, no lesions              Cervix: no lesions              Pap taken: yes Bimanual Exam:  Uterus:  normal size, contour, position, consistency, mobility, non-tender              Adnexa: no mass, fullness, tenderness              Rectal exam: yes.  Confirms.              Anus:  normal sphincter tone, no lesions  Chaperone was present for exam:  Kari HERO, CMA  ASSESSMENT: Well woman visit with gynecologic exam. Hx LGSIL 2021.   Status post prior conization and LEEP. Strong FH breast cancer.  Mother had precancer of the breast.  Anxiety and depression.  On Prozac . Hx laparotomy with right ovarian cystectomy. Post coital UTI. PHQ-2-9: 0  PLAN: Mammogram screening discussed. Self breast awareness reviewed. Pap and HRV collected:  yes Guidelines for Calcium, Vitamin D , regular exercise program including cardiovascular and weight bearing  exercise. Medication refills:  Nitrofurantoin  50 mg postcoital prn.  #30, RF 3.  Labs with PCP.  Follow up:  yearly and prn.

## 2023-12-27 ENCOUNTER — Other Ambulatory Visit (HOSPITAL_COMMUNITY)
Admission: RE | Admit: 2023-12-27 | Discharge: 2023-12-27 | Disposition: A | Discharge status: Home / Self Care | Source: Ambulatory Visit | Attending: Obstetrics and Gynecology | Admitting: Obstetrics and Gynecology

## 2023-12-27 ENCOUNTER — Ambulatory Visit (INDEPENDENT_AMBULATORY_CARE_PROVIDER_SITE_OTHER): Admitting: Obstetrics and Gynecology

## 2023-12-27 ENCOUNTER — Encounter: Payer: Self-pay | Admitting: Obstetrics and Gynecology

## 2023-12-27 VITALS — BP 114/78 | HR 77 | Ht 61.5 in | Wt 136.0 lb

## 2023-12-27 DIAGNOSIS — Z01419 Encounter for gynecological examination (general) (routine) without abnormal findings: Secondary | ICD-10-CM

## 2023-12-27 DIAGNOSIS — Z1331 Encounter for screening for depression: Secondary | ICD-10-CM | POA: Diagnosis not present

## 2023-12-27 DIAGNOSIS — Z124 Encounter for screening for malignant neoplasm of cervix: Secondary | ICD-10-CM | POA: Diagnosis present

## 2023-12-27 DIAGNOSIS — Z87410 Personal history of cervical dysplasia: Secondary | ICD-10-CM

## 2023-12-27 MED ORDER — NITROFURANTOIN MACROCRYSTAL 50 MG PO CAPS: ORAL_CAPSULE | ORAL | 3 refills | Status: AC

## 2023-12-27 NOTE — Patient Instructions (Signed)

## 2023-12-29 ENCOUNTER — Ambulatory Visit: Payer: Self-pay | Admitting: Obstetrics and Gynecology

## 2023-12-29 LAB — CYTOLOGY - PAP
Comment: NEGATIVE
Diagnosis: NEGATIVE
High risk HPV: NEGATIVE

## 2024-03-09 IMAGING — MG MM DIGITAL SCREENING BILAT W/ TOMO AND CAD
8 series · 8 of 24 positions shown · non-contrast
Comparison: Previous exam(s).

CLINICAL DATA: Screening.

EXAM:
DIGITAL SCREENING BILATERAL MAMMOGRAM WITH TOMOSYNTHESIS AND CAD
TECHNIQUE: Bilateral screening digital craniocaudal and mediolateral oblique
mammograms were obtained. Bilateral screening digital breast
tomosynthesis was performed. The images were evaluated with
computer-aided detection.

[R CC synth-2D]
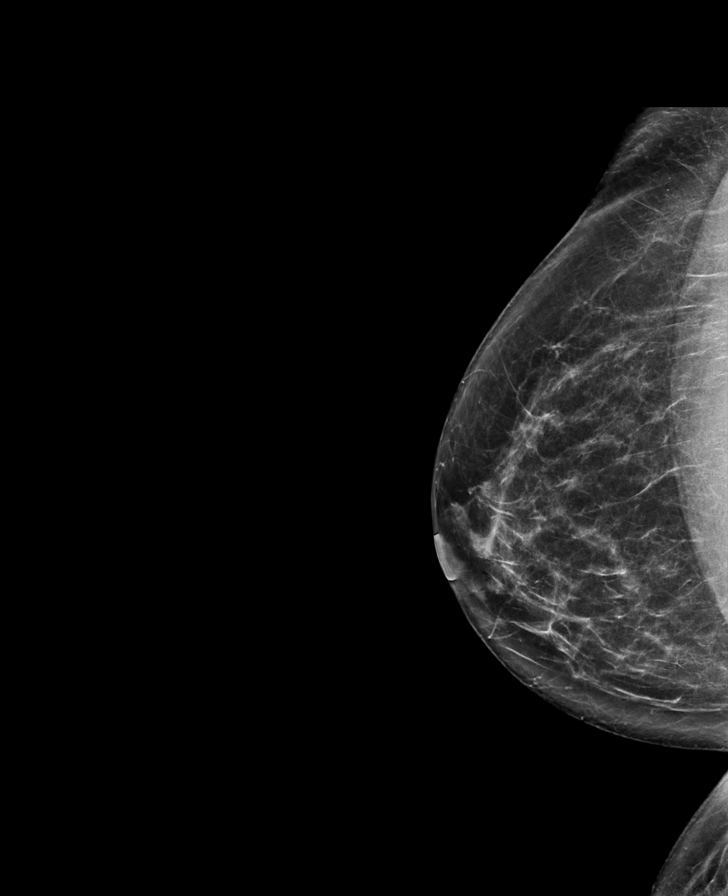

[L CC synth-2D]
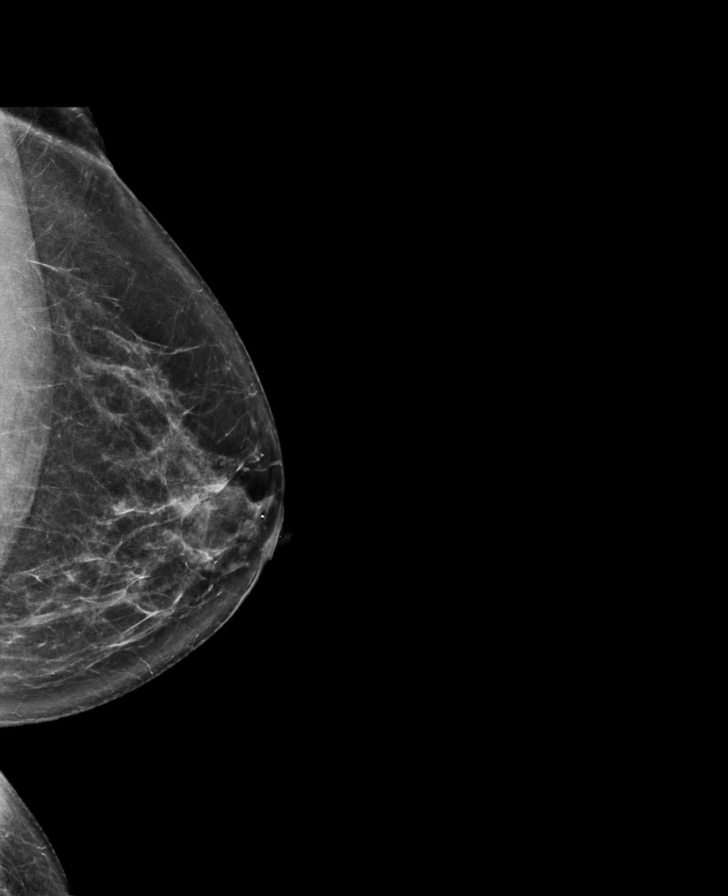

[R MLO synth-2D]
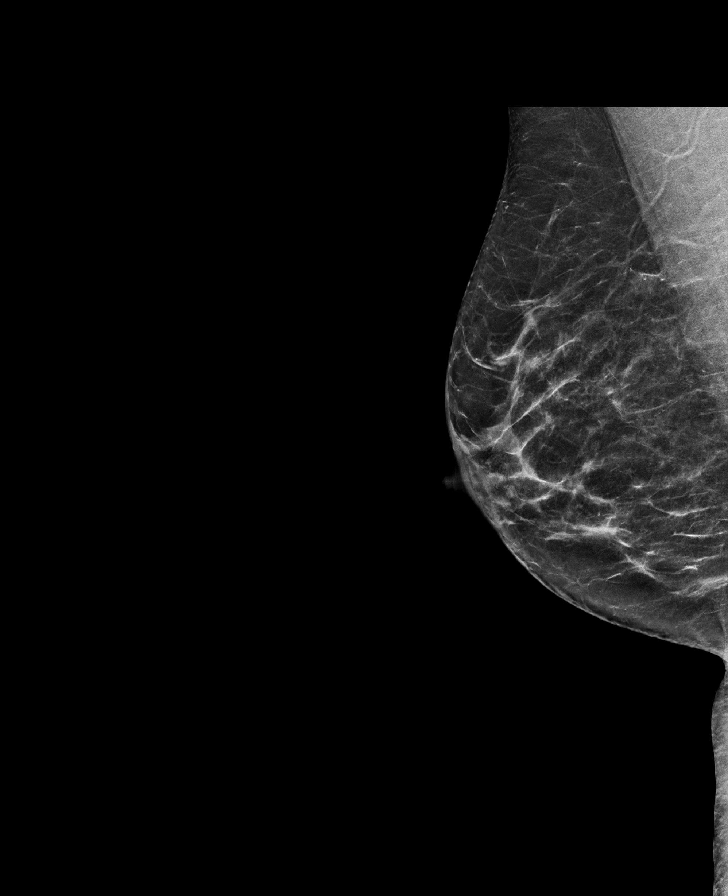

[L MLO synth-2D]
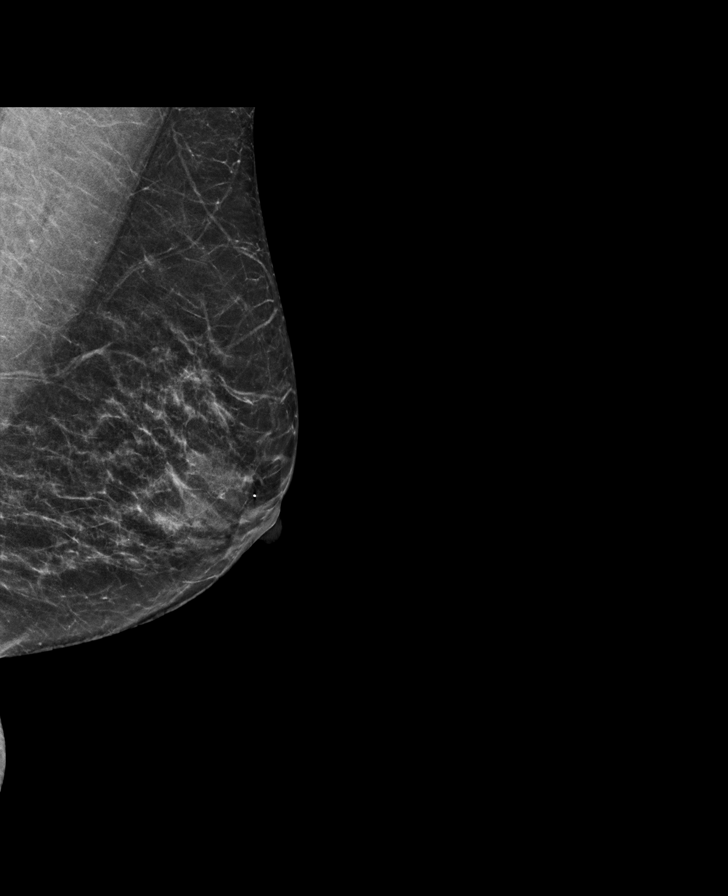

[L CC tomo · tomo slice 35/68.0]
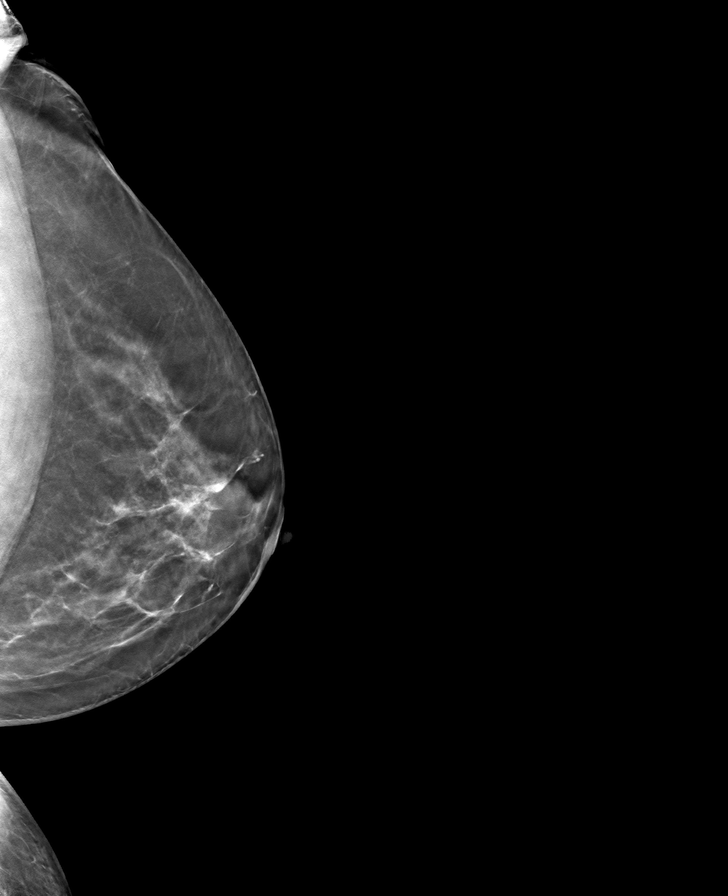

[R CC tomo · tomo slice 37/72.0]
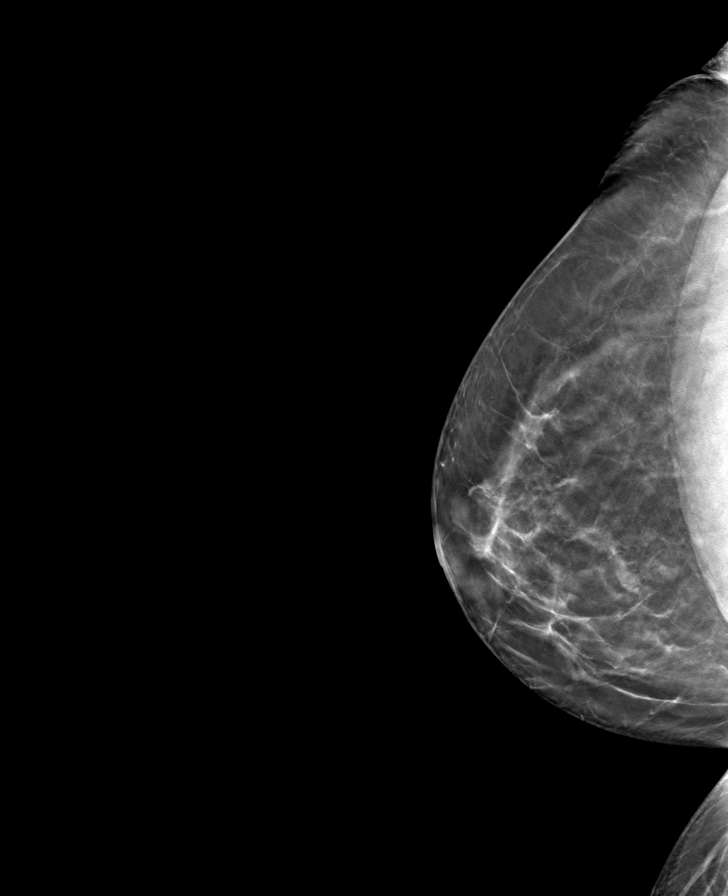

[L MLO tomo · tomo slice 30/59.0]
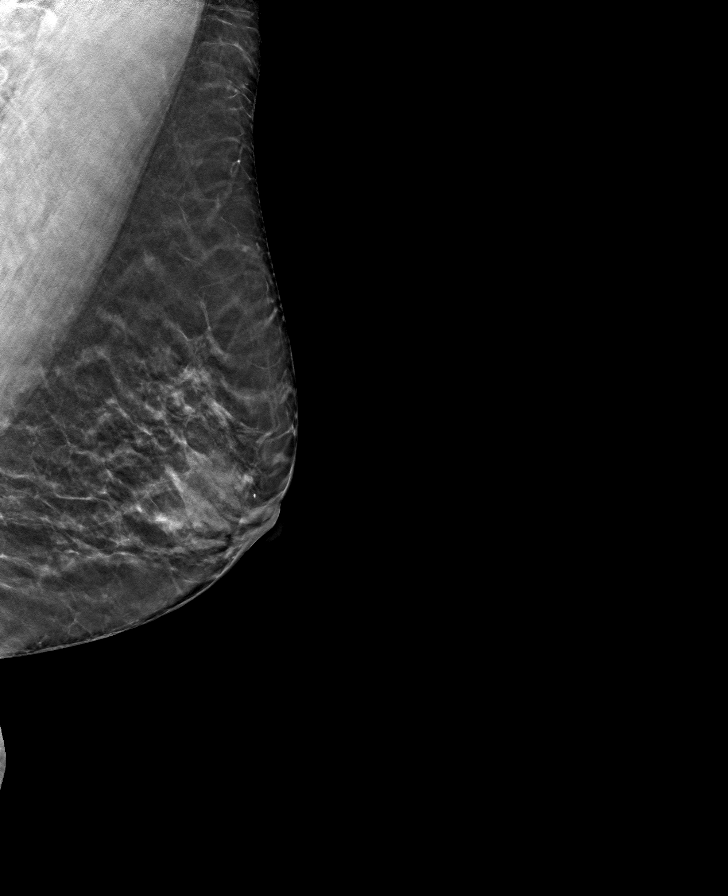

[R MLO tomo · tomo slice 33/64.0]
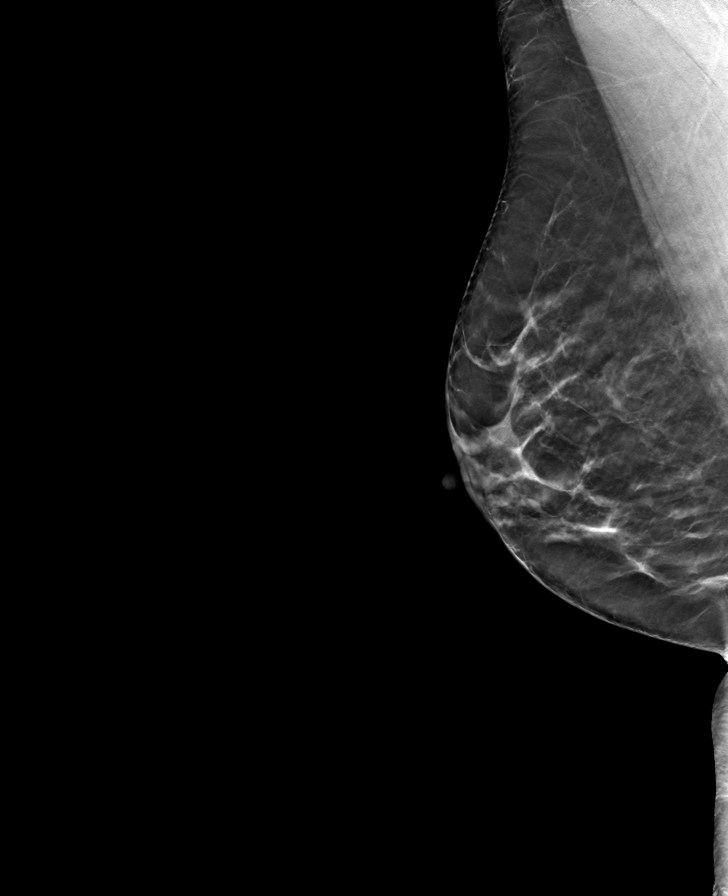

[8 of 24 positions shown; findings below may reference images not displayed]

ACR Breast Density Category b: There are scattered areas of
fibroglandular density.
FINDINGS: There are no findings suspicious for malignancy.
IMPRESSION: No mammographic evidence of malignancy. A result letter of this
screening mammogram will be mailed directly to the patient.

RECOMMENDATION:
Screening mammogram in one year. (Code:51-O-LD2)

BI-RADS CATEGORY  1: Negative.

## 2025-01-01 ENCOUNTER — Ambulatory Visit: Admitting: Obstetrics and Gynecology
